# Patient Record
Sex: Female | Born: 1945 | Race: White | Hispanic: No | Marital: Married | Smoking: Never smoker
Health system: Southern US, Community
[De-identification: ages and names within clinical notes are randomized; demographics above are authoritative.]

## PROBLEM LIST (undated history)

## (undated) DIAGNOSIS — K219 Gastro-esophageal reflux disease without esophagitis: Secondary | ICD-10-CM

## (undated) DIAGNOSIS — R51 Headache: Secondary | ICD-10-CM

## (undated) DIAGNOSIS — F419 Anxiety disorder, unspecified: Secondary | ICD-10-CM

## (undated) DIAGNOSIS — M1990 Unspecified osteoarthritis, unspecified site: Secondary | ICD-10-CM

## (undated) DIAGNOSIS — E079 Disorder of thyroid, unspecified: Secondary | ICD-10-CM

## (undated) DIAGNOSIS — H539 Unspecified visual disturbance: Secondary | ICD-10-CM

## (undated) DIAGNOSIS — F909 Attention-deficit hyperactivity disorder, unspecified type: Secondary | ICD-10-CM

## (undated) DIAGNOSIS — R519 Headache, unspecified: Secondary | ICD-10-CM

## (undated) HISTORY — PX: BREAST LUMPECTOMY: SHX2

## (undated) HISTORY — DX: Unspecified visual disturbance: H53.9

## (undated) HISTORY — DX: Headache: R51

## (undated) HISTORY — PX: ABDOMINAL HYSTERECTOMY: SHX81

## (undated) HISTORY — PX: BLADDER SURGERY: SHX569

## (undated) HISTORY — DX: Headache, unspecified: R51.9

---

## 2014-06-12 DIAGNOSIS — M810 Age-related osteoporosis without current pathological fracture: Secondary | ICD-10-CM | POA: Insufficient documentation

## 2014-06-12 DIAGNOSIS — F988 Other specified behavioral and emotional disorders with onset usually occurring in childhood and adolescence: Secondary | ICD-10-CM | POA: Insufficient documentation

## 2014-06-12 DIAGNOSIS — M545 Low back pain, unspecified: Secondary | ICD-10-CM | POA: Insufficient documentation

## 2014-06-12 DIAGNOSIS — M159 Polyosteoarthritis, unspecified: Secondary | ICD-10-CM | POA: Insufficient documentation

## 2014-06-12 DIAGNOSIS — R3911 Hesitancy of micturition: Secondary | ICD-10-CM | POA: Insufficient documentation

## 2014-10-02 ENCOUNTER — Encounter: Payer: Self-pay | Admitting: Neurology

## 2014-10-02 ENCOUNTER — Ambulatory Visit (INDEPENDENT_AMBULATORY_CARE_PROVIDER_SITE_OTHER): Payer: Medicare Other | Admitting: Neurology

## 2014-10-02 VITALS — BP 148/96 | HR 84 | Resp 14 | Ht 64.0 in | Wt 182.8 lb

## 2014-10-02 DIAGNOSIS — R200 Anesthesia of skin: Secondary | ICD-10-CM

## 2014-10-02 DIAGNOSIS — R260 Ataxic gait: Secondary | ICD-10-CM | POA: Insufficient documentation

## 2014-10-02 DIAGNOSIS — F909 Attention-deficit hyperactivity disorder, unspecified type: Secondary | ICD-10-CM

## 2014-10-02 DIAGNOSIS — F988 Other specified behavioral and emotional disorders with onset usually occurring in childhood and adolescence: Secondary | ICD-10-CM

## 2014-10-02 DIAGNOSIS — M542 Cervicalgia: Secondary | ICD-10-CM

## 2014-10-02 DIAGNOSIS — R51 Headache: Principal | ICD-10-CM

## 2014-10-02 DIAGNOSIS — R519 Headache, unspecified: Secondary | ICD-10-CM | POA: Insufficient documentation

## 2014-10-02 DIAGNOSIS — G4489 Other headache syndrome: Secondary | ICD-10-CM

## 2014-10-02 MED ORDER — OXCARBAZEPINE 150 MG PO TABS: ORAL_TABLET | ORAL | Status: DC

## 2014-10-02 NOTE — Progress Notes (Signed)
[facility]  PATIENT: Nicole Duncan DOB: 09-19-1945  REFERRING CLINICIAN: Al Hawks HISTORY FROM: patient REASON FOR VISIT: Headache   HISTORICAL  CHIEF COMPLAINT:  Chief Complaint  Patient presents with  . Headache    C/O short episodes of "electrical shock" sensation right side of head onset 4-6 weeks ago.  Sts. h/a's are worse.  Had been seen by [facility] at [facility] for neck pain, h/a, received tpi's there./fim  . Neck Pain    HISTORY OF PRESENT ILLNESS:  Nicole Duncan is a 69 year old woman who had an electric shock sensatoin on the rigth side of the head that lasted just seconds but was very intense.   Since then, she has had more of these episodes.   They now occur several times a day and she has some pain in between the episodes.    Pain is always on the right.     The intense pains occur randomly in any position.   Episodes started 6 - 7 weeks ago and there was nothing associated with the onset.   She denies vision changes with the headaches.   She had cataract surgery last year bilaterally.    She denies neck, shoulder or jaw pain.   No masticatory claudication.     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      I last saw her in 2012 when she was having neck pain.    A trigger point injection and tramadol helped at that time.    In 2003, she had had memory issues and an MRI showed non-=specific foci scatterred.    A repeat MRI on 2010 did not show much change.    MS is unlikely.     She still notes occasional memory issues and she makes mistakes frequently.     Adderall has helped her cognitive issues  I reviewed about 60 pages of notes from [facility] neurology from 2008 through 2012. An MRI of the cervical spine on 12/18/2009 showed multilevel degenerative changes with small disc protrusions at C3-C4 and C4-C5 and a broad disc protrusion combined with  spondylitic spurring at C5-C6. There did not appear to be nerve root impingement at any of these levels. A nerve conduction/EMG dated 06/02/2009 was normal in the left arm and left leg. A polysomnogram dated 12/19/2008 did not show any significant sleep apnea. However, she did have severe snoring. Sleep efficiency was slightly reduced at 82% due to fragmented sleep.  An MRI of the brain from 2010 showed multiple white matter foci specific pattern.    REVIEW OF SYSTEMS:  Constitutional: No fevers, chills, sweats, or change in appetite Eyes: No visual changes, double vision, eye pain Ear, nose and throat: No hearing loss, ear pain, nasal congestion, sore throat Cardiovascular: No chest pain, palpitations Respiratory:  No shortness of breath at rest or with exertion.   No wheezes GastrointestinaI: No nausea, vomiting, diarrhea, abdominal pain, fecal incontinence Genitourinary:  No dysuria, urinary retention or frequency.  No nocturia. Musculoskeletal:  No neck pain, back pain Integumentary: No rash, pruritus, skin lesions Neurological: as above Psychiatric: No depression at this time.  No anxiety Endocrine: No palpitations, diaphoresis, change in appetite, change in weigh or increased thirst Hematologic/Lymphatic:  No anemia, purpura, petechiae. Allergic/Immunologic: No itchy/runny eyes, nasal congestion, recent allergic reactions, rashes  ALLERGIES: Allergies  Allergen Reactions  .  Prochlorperazine     HOME MEDICATIONS:  Current outpatient prescriptions:  .  [START ON 10/22/2014] amphetamine-dextroamphetamine (ADDERALL) 12.5 MG tablet, Take 12.5 mg by mouth., Disp: , Rfl:  .  bumetanide (BUMEX) 1 MG tablet, , Disp: , Rfl: 5 .  cyanocobalamin (,VITAMIN B-12,) 1000 MCG/ML injection, , Disp: , Rfl: 1 .  liothyronine (CYTOMEL) 25 MCG tablet, Take 25 mcg by mouth daily., Disp: , Rfl: 5 .  OXcarbazepine (TRILEPTAL) 150 MG tablet, 1 pill in am and 2 pills at night, Disp: 90 tablet, Rfl: 11 .   SYNTHROID 100 MCG tablet, Take 100 mcg by mouth daily., Disp: , Rfl: 5   PAST MEDICAL HISTORY: Past Medical History  Diagnosis Date  . Headache   . Vision abnormalities     PAST SURGICAL HISTORY: Past Surgical History  Procedure Laterality Date  . Abdominal hysterectomy    . Bladder surgery    . Breast lumpectomy      FAMILY HISTORY: Family History  Problem Relation Age of Onset  . Dementia Mother   . Lung cancer Father     SOCIAL HISTORY:  History   Social History  . Marital Status: Married    Spouse Name: N/A    Number of Children: N/A  . Years of Education: N/A   Occupational History  . Not on file.   Social History Main Topics  . Smoking status: Never Smoker   . Smokeless tobacco: Not on file  . Alcohol Use: No  . Drug Use: No  . Sexual Activity: Not on file   Other Topics Concern  . Not on file   Social History Narrative  . No narrative on file     PHYSICAL EXAM  Filed Vitals:   10/02/14 1255  BP: 148/96  Pulse: 84  Resp: 14  Height: _0  (1.626 m)  Weight: 182 lb 12.8 oz (82.918 kg)    Body mass index is 31.36 kg/(m^2).   General: The patient is well-developed and well-nourished and in no acute distress  Neck: The neck is supple, no carotid bruits are noted.  The neck is mildly tender and ROM is reducedr.  Respiratory: The respiratory examination is clear.  Cardiovascular: The cardiovascular examination reveals a regular rate and rhythm, no murmurs, gallops or rubs are noted.  Skin: Extremities are without significant edema.  Neurologic Exam  Mental status: The patient is alert and oriented x 3 at the time of the examination. The patient has apparent normal recent and remote memory, with an apparently normal attention span and concentration ability.   Speech is normal.  Cranial nerves: Extraocular movements are full. Pupils are equal, round, and reactive to light and accomodation.  Visual fields are full.  Facial symmetry is  present. There is good facial sensation to soft touch bilaterally.Facial strength is normal.  Trapezius and sternocleidomastoid strength is normal. No dysarthria is noted.  The tongue is midline, and the patient has symmetric elevation of the soft palate. No obvious hearing deficits are noted.  Motor:  Muscle bulk and tone are normal. Strength is  5 / 5 in all 4 extremities.   Sensory: Sensory testing is intact to pinprick, soft touch on all 4 extremities but decreased vibratrion sensation in toes.  Coordination: Cerebellar testing reveals good finger-nose-finger and heel-to-shin bilaterally.  Gait and station: Station and gait are normal. Tandem gait is wide. Romberg is positive.   Reflexes: Deep tendon reflexes are symmetric and normal bilaterally. Plantar responses are normal.    DIAGNOSTIC  DATA (LABS, IMAGING, TESTING) - I reviewed patient records, labs, notes, testing and imaging myself where available.     ASSESSMENT AND PLAN  Other headache syndrome - Plan: MR Brain W Wo Contrast, Sedimentation Rate, Vitamin B12, PE+Interp(Rfx IFE),S, Glom Filt Rate, Estimated, Basic Metabolic Panel, CANCELED: BMP with eGFR, CANCELED: Electrophoresis, Protein and Immunofixation  Neck pain - Plan: MR Cervical Spine Wo Contrast, Sedimentation Rate  Ataxic gait - Plan: MR Brain W Wo Contrast, MR Cervical Spine Wo Contrast, Sedimentation Rate, Vitamin B12, PE+Interp(Rfx IFE),S, Basic Metabolic Panel, CANCELED: BMP with eGFR, CANCELED: Electrophoresis, Protein and Immunofixation  Numbness - Plan: MR Brain W Wo Contrast, MR Cervical Spine Wo Contrast, Sedimentation Rate, Vitamin B12, PE+Interp(Rfx IFE),S, Glom Filt Rate, Estimated, Basic Metabolic Panel, CANCELED: BMP with eGFR, CANCELED: Electrophoresis, Protein and Immunofixation  ADD (attention deficit disorder)   In summary, a 69 year old woman I have seen 4 years ago for neck pain who now has 2 new problems. She is experiencing sharp very  intermittent headaches superimposed on a milder chronic headache. Additionally she has had a progressive gait disturbance with loss of balance over the past few months. Etiology of the headache is not clear and I will check an ESR to rule out the possibility of temporal arteritis. Additionally, we will check an MRI of the head to rule out a structural etiology this is a recently acquired leg syndrome. I will place her on 150 mg by mouth 3 times a day oxcarbazepine. For her worsening gait issues, I will check an MRI of the cervical spine to make sure there is not a cervical myelopathy and also check vitamin B12 and SPEP as she has numbness in her feet. The possible neuropathy is does not seem bad enough to explain poor gait, however.  She will return to see me in about 2 months or sooner if she has new or worsening symptoms based on results. If her gait continues to worsen, I will check a nerve conduction study and EMG    Antonio Creswell A. Felecia Shelling, MD, PhD 09/04/1094, 0:45 PM Certified in Neurology, [place], Sleep Medicine, Pain Medicine and Neuroimaging  [facility] [address] [place], [place] [address] [phone]

## 2014-10-03 LAB — SEDIMENTATION RATE: SED RATE: 5 mm/h (ref 0–40)

## 2014-10-03 LAB — VITAMIN B12: VITAMIN B 12: 1177 pg/mL — AB (ref 211–946)

## 2014-10-04 LAB — PE+INTERP(RFX IFE),S
A/G RATIO SPE: 1.5 (ref 0.7–2.0)
ALBUMIN ELP: 4.4 g/dL (ref 3.2–5.6)
Alpha 1: 0.2 g/dL (ref 0.1–0.4)
Alpha 2: 0.9 g/dL (ref 0.4–1.2)
Beta: 1 g/dL (ref 0.6–1.3)
Gamma Globulin: 0.9 g/dL (ref 0.5–1.6)
Globulin, Total: 3 g/dL (ref 2.0–4.5)
Total Protein: 7.4 g/dL (ref 6.0–8.5)

## 2014-10-04 LAB — BASIC METABOLIC PANEL
BUN/Creatinine Ratio: 15 (ref 11–26)
BUN: 10 mg/dL (ref 8–27)
CALCIUM: 10.4 mg/dL — AB (ref 8.7–10.3)
CHLORIDE: 101 mmol/L (ref 97–108)
CO2: 25 mmol/L (ref 18–29)
Creatinine, Ser: 0.68 mg/dL (ref 0.57–1.00)
GFR calc Af Amer: 105 mL/min/{1.73_m2} (ref >59)
GFR, EST NON AFRICAN AMERICAN: 91 mL/min/{1.73_m2} (ref >59)
GLUCOSE: 96 mg/dL (ref 65–99)
Potassium: 4.6 mmol/L (ref 3.5–5.2)
Sodium: 143 mmol/L (ref 134–144)

## 2014-10-10 ENCOUNTER — Ambulatory Visit
Admission: RE | Admit: 2014-10-10 | Discharge: 2014-10-10 | Disposition: A | Discharge status: Home / Self Care | Payer: Medicare Other | Source: Ambulatory Visit | Attending: Neurology | Admitting: Neurology

## 2014-10-10 DIAGNOSIS — R260 Ataxic gait: Secondary | ICD-10-CM

## 2014-10-10 DIAGNOSIS — G4489 Other headache syndrome: Secondary | ICD-10-CM

## 2014-10-10 DIAGNOSIS — R200 Anesthesia of skin: Secondary | ICD-10-CM

## 2014-10-10 DIAGNOSIS — M542 Cervicalgia: Secondary | ICD-10-CM

## 2014-10-10 MED ORDER — DIPHENHYDRAMINE HCL 50 MG PO CAPS
50.0000 mg | ORAL_CAPSULE | Freq: Once | ORAL | Status: AC
  Administered 2014-10-10: 50 mg via ORAL

## 2014-10-10 MED ORDER — GADOBENATE DIMEGLUMINE 529 MG/ML IV SOLN
15.0000 mL | Freq: Once | INTRAVENOUS | Status: AC | PRN
  Administered 2014-10-10: 15 mL via INTRAVENOUS

## 2014-10-10 NOTE — Progress Notes (Signed)
Pt developed hives and itching during MRI. Benadyl given and Dr. Jola Baptist in to speak to pt and her husband.

## 2014-10-15 ENCOUNTER — Telehealth: Payer: Self-pay | Admitting: *Deleted

## 2014-10-15 NOTE — Telephone Encounter (Signed)
Patient calling wanting MRI results. Patient missed call

## 2014-10-15 NOTE — Telephone Encounter (Signed)
Spoke with Nicole Duncan and per RAS, advised that mri brain is very similar to mri brain done in 2010, that there are a couple of small spots that should not produce sx.  Advised that mri c-spine basically unchanged from 2011.  She verbalized understanding of same/fim

## 2014-10-20 ENCOUNTER — Other Ambulatory Visit: Payer: Self-pay

## 2014-12-12 DIAGNOSIS — I10 Essential (primary) hypertension: Secondary | ICD-10-CM | POA: Insufficient documentation

## 2015-04-09 DIAGNOSIS — K219 Gastro-esophageal reflux disease without esophagitis: Secondary | ICD-10-CM | POA: Insufficient documentation

## 2015-04-09 DIAGNOSIS — K659 Peritonitis, unspecified: Secondary | ICD-10-CM | POA: Insufficient documentation

## 2015-05-15 DIAGNOSIS — R002 Palpitations: Secondary | ICD-10-CM | POA: Insufficient documentation

## 2015-09-15 DIAGNOSIS — Z139 Encounter for screening, unspecified: Secondary | ICD-10-CM | POA: Diagnosis not present

## 2015-09-15 DIAGNOSIS — Z1231 Encounter for screening mammogram for malignant neoplasm of breast: Secondary | ICD-10-CM | POA: Diagnosis not present

## 2015-10-08 DIAGNOSIS — M150 Primary generalized (osteo)arthritis: Secondary | ICD-10-CM | POA: Diagnosis not present

## 2015-10-08 DIAGNOSIS — M79672 Pain in left foot: Secondary | ICD-10-CM | POA: Diagnosis not present

## 2015-10-08 DIAGNOSIS — R51 Headache: Secondary | ICD-10-CM | POA: Diagnosis not present

## 2015-10-08 DIAGNOSIS — E039 Hypothyroidism, unspecified: Secondary | ICD-10-CM | POA: Diagnosis not present

## 2015-10-08 DIAGNOSIS — F988 Other specified behavioral and emotional disorders with onset usually occurring in childhood and adolescence: Secondary | ICD-10-CM | POA: Diagnosis not present

## 2015-10-08 DIAGNOSIS — I10 Essential (primary) hypertension: Secondary | ICD-10-CM | POA: Diagnosis not present

## 2015-10-08 DIAGNOSIS — M79673 Pain in unspecified foot: Secondary | ICD-10-CM | POA: Insufficient documentation

## 2015-12-27 DIAGNOSIS — I10 Essential (primary) hypertension: Secondary | ICD-10-CM | POA: Diagnosis not present

## 2015-12-27 DIAGNOSIS — R112 Nausea with vomiting, unspecified: Secondary | ICD-10-CM | POA: Diagnosis not present

## 2015-12-27 DIAGNOSIS — N390 Urinary tract infection, site not specified: Secondary | ICD-10-CM | POA: Diagnosis not present

## 2015-12-27 DIAGNOSIS — K529 Noninfective gastroenteritis and colitis, unspecified: Secondary | ICD-10-CM | POA: Diagnosis not present

## 2015-12-27 DIAGNOSIS — R110 Nausea: Secondary | ICD-10-CM | POA: Diagnosis not present

## 2015-12-27 DIAGNOSIS — K2970 Gastritis, unspecified, without bleeding: Secondary | ICD-10-CM | POA: Diagnosis not present

## 2015-12-27 DIAGNOSIS — R1111 Vomiting without nausea: Secondary | ICD-10-CM | POA: Diagnosis not present

## 2015-12-27 DIAGNOSIS — R109 Unspecified abdominal pain: Secondary | ICD-10-CM | POA: Diagnosis not present

## 2015-12-30 DIAGNOSIS — R1314 Dysphagia, pharyngoesophageal phase: Secondary | ICD-10-CM | POA: Diagnosis not present

## 2015-12-30 DIAGNOSIS — K219 Gastro-esophageal reflux disease without esophagitis: Secondary | ICD-10-CM | POA: Diagnosis not present

## 2015-12-30 DIAGNOSIS — R1030 Lower abdominal pain, unspecified: Secondary | ICD-10-CM | POA: Diagnosis not present

## 2015-12-30 DIAGNOSIS — R1013 Epigastric pain: Secondary | ICD-10-CM | POA: Diagnosis not present

## 2016-01-20 DIAGNOSIS — R1030 Lower abdominal pain, unspecified: Secondary | ICD-10-CM | POA: Diagnosis not present

## 2016-01-20 DIAGNOSIS — K229 Disease of esophagus, unspecified: Secondary | ICD-10-CM | POA: Diagnosis not present

## 2016-01-20 DIAGNOSIS — R1310 Dysphagia, unspecified: Secondary | ICD-10-CM | POA: Diagnosis not present

## 2016-01-20 DIAGNOSIS — K649 Unspecified hemorrhoids: Secondary | ICD-10-CM | POA: Diagnosis not present

## 2016-01-20 DIAGNOSIS — Z1211 Encounter for screening for malignant neoplasm of colon: Secondary | ICD-10-CM | POA: Diagnosis not present

## 2016-02-18 ENCOUNTER — Other Ambulatory Visit: Payer: Self-pay | Admitting: Neurology

## 2016-02-26 ENCOUNTER — Ambulatory Visit (INDEPENDENT_AMBULATORY_CARE_PROVIDER_SITE_OTHER): Payer: PPO | Admitting: Neurology

## 2016-02-26 ENCOUNTER — Encounter: Payer: Self-pay | Admitting: Neurology

## 2016-02-26 VITALS — BP 148/88 | HR 76 | Resp 18 | Ht 64.0 in | Wt 185.0 lb

## 2016-02-26 DIAGNOSIS — M542 Cervicalgia: Secondary | ICD-10-CM | POA: Diagnosis not present

## 2016-02-26 DIAGNOSIS — R51 Headache: Secondary | ICD-10-CM

## 2016-02-26 DIAGNOSIS — M545 Low back pain, unspecified: Secondary | ICD-10-CM

## 2016-02-26 DIAGNOSIS — R930 Abnormal findings on diagnostic imaging of skull and head, not elsewhere classified: Secondary | ICD-10-CM | POA: Diagnosis not present

## 2016-02-26 DIAGNOSIS — R519 Headache, unspecified: Secondary | ICD-10-CM

## 2016-02-26 DIAGNOSIS — R9082 White matter disease, unspecified: Secondary | ICD-10-CM | POA: Insufficient documentation

## 2016-02-26 MED ORDER — OXCARBAZEPINE 150 MG PO TABS: ORAL_TABLET | ORAL | Status: DC

## 2016-02-26 NOTE — Progress Notes (Signed)
[facility]  PATIENT: Nicole Duncan DOB: 1946-01-14  REFERRING CLINICIAN: Al Hawks HISTORY FROM: patient REASON FOR VISIT: Headache   HISTORICAL  CHIEF COMPLAINT:  Chief Complaint  Patient presents with  . Neck Pain    Sts. neck pain is improved. Sts. electrical shock sensation h/a's are generally improved with Oxcarbazepine, but have been some worse lately with extra stress associated with her husband's poor health./fim  . Headache  . Gait Disturbance    HISTORY OF PRESENT ILLNESS:  Nicole Duncan is a 70 year old woman who was having frequent electric shock sensations on the rigth side of the head lasting at a time.   Since starting oxcarbazepine (150 am -300 mg pm) she is much better.   They now occur 0-2  times a day   The intense pains occur randomly in any position.    She has other types of headaches that occur with less intensity but last for hours and seem to start in his neck.    She denies vision changes with the headaches.    She denies neck, shoulder or jaw pain.   No masticatory claudication.     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 Trigger point injections have helped a lot.    An MRI of the cervical spine on 12/18/2009 showed multilevel degenerative changes with small disc protrusions at C3-C4 and C4-C5 and a broad disc protrusion combined with spondylitic spurring at C5-C6. There did not appear to be nerve root impingement at any of these levels. A nerve conduction/EMG dated 06/02/2009 was normal in the left arm and left leg.    Other:  In 2003, she had had memory issues and an MRI showed non-=specific foci scatterred.    A repeat MRI on 2010 did not show much change.    MS is unlikely.     She still notes occasional memory issues and she makes mistakes frequently.     Adderall has helped her cognitive issues    A polysomnogram dated 12/19/2008  did not show any significant sleep apnea. However, she did have severe snoring. Sleep efficiency was slightly reduced at 82% due to fragmented sleep.     REVIEW OF SYSTEMS:  Constitutional: No fevers, chills, sweats, or change in appetite.   Some insomnia Eyes: No visual changes, double vision, eye pain Ear, nose and throat: No hearing loss, ear pain, nasal congestion, sore throat Cardiovascular: No chest pain, palpitations Respiratory:  No shortness of breath at rest or with exertion.   No wheezes.   Snores at night GastrointestinaI: No nausea, vomiting, diarrhea, abdominal pain, fecal incontinence Genitourinary:  No dysuria, urinary retention or frequency.  No nocturia. Musculoskeletal:  as above Integumentary: No rash, pruritus, skin lesions Neurological: as above Psychiatric: No depression at this time.  No anxiety Endocrine: No palpitations, diaphoresis, change in appetite, change in weigh or increased thirst Hematologic/Lymphatic:  No anemia, purpura, petechiae. Allergic/Immunologic: No itchy/runny eyes, nasal congestion, recent allergic reactions, rashes  ALLERGIES: Allergies  Allergen Reactions  . Gadolinium Derivatives Hives and Itching  . Compazine [Prochlorperazine Edisylate] Other (See Comments)    "Draws" eyes and mouth  . Prochlorperazine Other (See Comments)    "Draws" eyes and mouth  . Iodinated Diagnostic Agents Rash    HOME MEDICATIONS:  Current outpatient prescriptions:  .  amphetamine-dextroamphetamine (ADDERALL) 12.5 MG tablet, Take 12.5  mg by mouth., Disp: , Rfl:  .  bumetanide (BUMEX) 1 MG tablet, , Disp: , Rfl: 5 .  cyanocobalamin (,VITAMIN B-12,) 1000 MCG/ML injection, , Disp: , Rfl: 1 .  liothyronine (CYTOMEL) 25 MCG tablet, Take 25 mcg by mouth daily., Disp: , Rfl: 5 .  omeprazole (PRILOSEC) 40 MG capsule, Take 40 mg by mouth., Disp: , Rfl:  .  OXcarbazepine (TRILEPTAL) 150 MG tablet, 1 pill in am and 2 pills at night, Disp: 90 tablet, Rfl: 11 .   SYNTHROID 100 MCG tablet, Take 100 mcg by mouth daily., Disp: , Rfl: 5   PAST MEDICAL HISTORY: Past Medical History  Diagnosis Date  . Headache   . Vision abnormalities     PAST SURGICAL HISTORY: Past Surgical History  Procedure Laterality Date  . Abdominal hysterectomy    . Bladder surgery    . Breast lumpectomy      FAMILY HISTORY: Family History  Problem Relation Age of Onset  . Dementia Mother   . Lung cancer Father     SOCIAL HISTORY:  Social History   Social History  . Marital Status: Married    Spouse Name: N/A  . Number of Children: N/A  . Years of Education: N/A   Occupational History  . Not on file.   Social History Main Topics  . Smoking status: Never Smoker   . Smokeless tobacco: Not on file  . Alcohol Use: No  . Drug Use: No  . Sexual Activity: Not on file   Other Topics Concern  . Not on file   Social History Narrative     PHYSICAL EXAM  Filed Vitals:   02/26/16 0911  BP: 148/88  Pulse: 76  Resp: 18  Height: 5\' 4"  (1.626 m)  Weight: 185 lb (83.915 kg)    Body mass index is 31.74 kg/(m^2).   General: The patient is well-developed and well-nourished and in no acute distress  Neck: The neck is supple.  The neck is non - tender and ROM is mildly reduced.  Respiratory: The respiratory examination is clear.  Cardiovascular: The cardiovascular examination reveals a regular rate and rhythm, no murmurs, gallops or rubs are noted.  Skin: Extremities are without rash or edema.  Musculoskeletal:   Has some arthritic deformities in hands.  Neurologic Exam  Mental status: The patient is alert and oriented x 3 at the time of the examination. The patient has apparent normal recent and remote memory, with an apparently normal attention span and concentration ability.   Speech is normal.  Cranial nerves: Extraocular movements are full.  . There is good facial sensation to soft touch bilaterally.Facial strength is normal.  Trapezius and  sternocleidomastoid strength is normal. No dysarthria is noted.  The tongue is midline, and the patient has symmetric elevation of the soft palate. No obvious hearing deficits are noted.  Motor:  Muscle bulk and tone are normal. Strength is  5 / 5 in all 4 extremities.   Sensory: Sensory testing is intact to soft touch on all 4 extremities but decreased vibratrion sensation in toes.  Coordination: Cerebellar testing reveals good finger-nose-fingerbilaterally.  Gait and station: Station and gait are normal. Tandem gait is wide.   Reflexes: Deep tendon reflexes are symmetric and normal bilaterally. Plantar responses are normal.    DIAGNOSTIC DATA (LABS, IMAGING, TESTING) - I reviewed patient records, labs, notes, testing and imaging myself where available.     ASSESSMENT AND PLAN  Headache disorder  Neck pain  Midline low  back pain without sciatica  White matter abnormality on MRI of brain    1.  Refill oxcarbazepine 2.  Stay active, exercise as tolerated  She will return to see me in about one year or sooner if she has new or worsening symptoms based on results.     Alayah Knouff A. Felecia Shelling, MD, PhD [id number], [id number] AM Certified in Neurology, Clinical Neurophysiology, Sleep Medicine, Pain Medicine and Neuroimaging  [facility] [address], [place], [place] [address] [phone]

## 2016-04-01 DIAGNOSIS — F419 Anxiety disorder, unspecified: Secondary | ICD-10-CM | POA: Diagnosis not present

## 2016-04-01 DIAGNOSIS — M170 Bilateral primary osteoarthritis of knee: Secondary | ICD-10-CM | POA: Diagnosis not present

## 2016-04-01 DIAGNOSIS — F988 Other specified behavioral and emotional disorders with onset usually occurring in childhood and adolescence: Secondary | ICD-10-CM | POA: Diagnosis not present

## 2016-04-01 DIAGNOSIS — Z23 Encounter for immunization: Secondary | ICD-10-CM | POA: Diagnosis not present

## 2016-04-01 DIAGNOSIS — M65342 Trigger finger, left ring finger: Secondary | ICD-10-CM | POA: Diagnosis not present

## 2016-04-01 DIAGNOSIS — R300 Dysuria: Secondary | ICD-10-CM | POA: Diagnosis not present

## 2016-04-05 DIAGNOSIS — Z23 Encounter for immunization: Secondary | ICD-10-CM | POA: Diagnosis not present

## 2016-05-18 DIAGNOSIS — I10 Essential (primary) hypertension: Secondary | ICD-10-CM | POA: Diagnosis not present

## 2016-05-18 DIAGNOSIS — F4322 Adjustment disorder with anxiety: Secondary | ICD-10-CM | POA: Diagnosis not present

## 2016-05-18 DIAGNOSIS — M150 Primary generalized (osteo)arthritis: Secondary | ICD-10-CM | POA: Diagnosis not present

## 2016-05-18 DIAGNOSIS — Z23 Encounter for immunization: Secondary | ICD-10-CM | POA: Diagnosis not present

## 2016-05-18 DIAGNOSIS — F988 Other specified behavioral and emotional disorders with onset usually occurring in childhood and adolescence: Secondary | ICD-10-CM | POA: Diagnosis not present

## 2016-05-19 ENCOUNTER — Telehealth: Payer: Self-pay | Admitting: *Deleted

## 2016-05-19 MED ORDER — OXCARBAZEPINE 150 MG PO TABS: ORAL_TABLET | ORAL | 3 refills | Status: AC

## 2016-05-19 NOTE — Telephone Encounter (Signed)
Received fax request for 90 day supply of Oxcarbazepine.  Rx. escribed to [facility] as requested/fim

## 2016-06-07 DIAGNOSIS — M25561 Pain in right knee: Secondary | ICD-10-CM | POA: Diagnosis not present

## 2016-06-07 DIAGNOSIS — M25562 Pain in left knee: Secondary | ICD-10-CM | POA: Diagnosis not present

## 2016-06-07 DIAGNOSIS — M7051 Other bursitis of knee, right knee: Secondary | ICD-10-CM | POA: Diagnosis not present

## 2016-06-07 DIAGNOSIS — M150 Primary generalized (osteo)arthritis: Secondary | ICD-10-CM | POA: Diagnosis not present

## 2016-06-07 DIAGNOSIS — M7052 Other bursitis of knee, left knee: Secondary | ICD-10-CM | POA: Diagnosis not present

## 2016-06-29 DIAGNOSIS — H43393 Other vitreous opacities, bilateral: Secondary | ICD-10-CM | POA: Diagnosis not present

## 2016-06-29 DIAGNOSIS — Z961 Presence of intraocular lens: Secondary | ICD-10-CM | POA: Diagnosis not present

## 2016-06-29 DIAGNOSIS — H5203 Hypermetropia, bilateral: Secondary | ICD-10-CM | POA: Diagnosis not present

## 2016-06-29 DIAGNOSIS — H52203 Unspecified astigmatism, bilateral: Secondary | ICD-10-CM | POA: Diagnosis not present

## 2016-06-29 DIAGNOSIS — H524 Presbyopia: Secondary | ICD-10-CM | POA: Diagnosis not present

## 2016-06-29 DIAGNOSIS — H16223 Keratoconjunctivitis sicca, not specified as Sjogren's, bilateral: Secondary | ICD-10-CM | POA: Diagnosis not present

## 2016-09-22 DIAGNOSIS — Z1231 Encounter for screening mammogram for malignant neoplasm of breast: Secondary | ICD-10-CM | POA: Diagnosis not present

## 2016-09-28 DIAGNOSIS — M65342 Trigger finger, left ring finger: Secondary | ICD-10-CM | POA: Diagnosis not present

## 2016-09-28 DIAGNOSIS — M1711 Unilateral primary osteoarthritis, right knee: Secondary | ICD-10-CM | POA: Diagnosis not present

## 2016-09-28 DIAGNOSIS — E539 Vitamin B deficiency, unspecified: Secondary | ICD-10-CM | POA: Diagnosis not present

## 2016-11-16 DIAGNOSIS — I10 Essential (primary) hypertension: Secondary | ICD-10-CM | POA: Diagnosis not present

## 2016-11-16 DIAGNOSIS — E538 Deficiency of other specified B group vitamins: Secondary | ICD-10-CM | POA: Diagnosis not present

## 2017-01-05 DIAGNOSIS — F988 Other specified behavioral and emotional disorders with onset usually occurring in childhood and adolescence: Secondary | ICD-10-CM | POA: Diagnosis not present

## 2017-01-05 DIAGNOSIS — I10 Essential (primary) hypertension: Secondary | ICD-10-CM | POA: Diagnosis not present

## 2017-01-05 DIAGNOSIS — R5383 Other fatigue: Secondary | ICD-10-CM | POA: Diagnosis not present

## 2017-02-08 DIAGNOSIS — R3911 Hesitancy of micturition: Secondary | ICD-10-CM | POA: Diagnosis not present

## 2017-02-08 DIAGNOSIS — R339 Retention of urine, unspecified: Secondary | ICD-10-CM | POA: Diagnosis not present

## 2017-03-08 ENCOUNTER — Encounter: Payer: Self-pay | Admitting: Neurology

## 2017-03-08 ENCOUNTER — Ambulatory Visit (INDEPENDENT_AMBULATORY_CARE_PROVIDER_SITE_OTHER): Payer: PPO | Admitting: Neurology

## 2017-03-08 VITALS — BP 142/76 | HR 71 | Resp 20 | Ht 64.0 in | Wt 196.5 lb

## 2017-03-08 DIAGNOSIS — R200 Anesthesia of skin: Secondary | ICD-10-CM | POA: Diagnosis not present

## 2017-03-08 DIAGNOSIS — R51 Headache: Secondary | ICD-10-CM | POA: Diagnosis not present

## 2017-03-08 DIAGNOSIS — R3911 Hesitancy of micturition: Secondary | ICD-10-CM | POA: Diagnosis not present

## 2017-03-08 DIAGNOSIS — M542 Cervicalgia: Secondary | ICD-10-CM | POA: Diagnosis not present

## 2017-03-08 DIAGNOSIS — R519 Headache, unspecified: Secondary | ICD-10-CM

## 2017-03-08 NOTE — Progress Notes (Signed)
[facility]  PATIENT: Nicole Duncan DOB: 1946-01-28  REFERRING CLINICIAN: Al Hawks HISTORY FROM: patient REASON FOR VISIT: Headache   HISTORICAL  CHIEF COMPLAINT:  Chief Complaint  Patient presents with  . Headache    Sts. h/a's  have been ok.  No more "zings in my head" since taking Oxcarbazepine.  Is having difficulty with urine stream and sts. urologist says Oxcarbazepine may be causing this/fim  . Neck Pain    HISTORY OF PRESENT ILLNESS:  Nicole Duncan is a 71 year old woman who was having frequent electric shock sensations on the rigth side of the head  She started oxcarbazepine (150 am -300 mg pm) and the dysesthetic sensations got much better.    She is now taking 150 mg bid.    Since last year she has also had urinary retention and her urologist told her that trileptal may be playing a role.   She has only had a few 'zings' the past 6 months.    The intense pains occur randomly in any position.    She has other types of headaches that occur with less intensity but last for hours and seem to start in his neck.    She denies vision changes with the headaches.    She denies neck, shoulder or jaw pain.   No masticatory claudication.     She reports some neck pain but it was worse in the past.    The trigger point injwections helped her pain a lot and severe pain never returned.     An MRI of the cervical spine on 12/18/2009 showed multilevel degenerative changes with small disc protrusions at C3-C4 and C4-C5 and a broad disc protrusion combined with spondylitic spurring at C5-C6. There did not appear to be nerve root impingement at any of these levels. A nerve conduction/EMG dated 06/02/2009 was normal in the left arm and left leg.    Other:  In 2003, she had had memory issues and an MRI showed non-=specific foci scatterred.    A repeat MRI on 2010 did not show much change.    MS is unlikely.     She still notes occasional memory issues and she makes  mistakes frequently.     Adderall has helped her cognitive issues  Snoring/sleep:   She has some sleep onset insomnia and also some sleep maintenance insomnia due to her husbands illness (he hasas severe poyneuropathy)  A polysomnogram dated 12/19/2008 did not show any significant sleep apnea. However, she did have severe snoring. Sleep efficiency was slightly reduced at 82% due to fragmented sleep.     REVIEW OF SYSTEMS:  Constitutional: No fevers, chills, sweats, or change in appetite.   Some insomnia Eyes: No visual changes, double vision, eye pain Ear, nose and throat: No hearing loss, ear pain, nasal congestion, sore throat Cardiovascular: No chest pain, palpitations Respiratory:  No shortness of breath at rest or with exertion.   No wheezes.   Snores at night GastrointestinaI: No nausea, vomiting, diarrhea, abdominal pain, fecal incontinence Genitourinary:  No dysuria, urinary retention or frequency.  No nocturia. Musculoskeletal:  as above Integumentary: No rash, pruritus, skin lesions Neurological: as above Psychiatric: No depression at this time.  No anxiety Endocrine: No palpitations, diaphoresis, change in appetite, change in weigh or increased thirst Hematologic/Lymphatic:  No anemia, purpura, petechiae. Allergic/Immunologic: No itchy/runny eyes, nasal congestion, recent allergic reactions, rashes  ALLERGIES: Allergies  Allergen Reactions  . Gadolinium Derivatives Hives and Itching  . Compazine [Prochlorperazine  Edisylate] Other (See Comments)    "Draws" eyes and mouth  . Prochlorperazine Other (See Comments)    "Draws" eyes and mouth  . Iodinated Diagnostic Agents Rash    HOME MEDICATIONS:  Current Outpatient Prescriptions:  .  amphetamine-dextroamphetamine (ADDERALL) 12.5 MG tablet, Take 12.5 mg by mouth., Disp: , Rfl:  .  bumetanide (BUMEX) 1 MG tablet, , Disp: , Rfl: 5 .  cyanocobalamin (,VITAMIN B-12,) 1000 MCG/ML injection, , Disp: , Rfl: 1 .  liothyronine  (CYTOMEL) 25 MCG tablet, Take 25 mcg by mouth daily., Disp: , Rfl: 5 .  omeprazole (PRILOSEC) 40 MG capsule, Take 40 mg by mouth., Disp: , Rfl:  .  OXcarbazepine (TRILEPTAL) 150 MG tablet, 1 pill in am and 2 pills at night, Disp: 270 tablet, Rfl: 3 .  SYNTHROID 100 MCG tablet, Take 100 mcg by mouth daily., Disp: , Rfl: 5   PAST MEDICAL HISTORY: Past Medical History:  Diagnosis Date  . Headache   . Vision abnormalities     PAST SURGICAL HISTORY: Past Surgical History:  Procedure Laterality Date  . ABDOMINAL HYSTERECTOMY    . BLADDER SURGERY    . BREAST LUMPECTOMY      FAMILY HISTORY: Family History  Problem Relation Age of Onset  . Dementia Mother   . Lung cancer Father     SOCIAL HISTORY:  Social History   Social History  . Marital status: Married    Spouse name: N/A  . Number of children: N/A  . Years of education: N/A   Occupational History  . Not on file.   Social History Main Topics  . Smoking status: Never Smoker  . Smokeless tobacco: Never Used  . Alcohol use No  . Drug use: No  . Sexual activity: Not on file   Other Topics Concern  . Not on file   Social History Narrative  . No narrative on file     PHYSICAL EXAM  Vitals:   03/08/17 1303  BP: (!) 142/76  Pulse: 71  Resp: 20  Weight: 196 lb 8 oz (89.1 kg)  Height: 5\' 4"  (1.626 m)    Body mass index is 33.73 kg/m.   General: The patient is well-developed and well-nourished and in no acute distress  Neck: The neck is nontendereck is non - tender and ROM is mildly reduced.   Neurologic Exam  Mental status: The patient is alert and oriented x 3 at the time of the examination. The patient has apparent normal recent and remote memory, with an apparently normal attention span and concentration ability.   Speech is normal.  Cranial nerves: Extraocular movements are full.  Facial strength and sensation is normal.  The tongue is midline, and the patient has symmetric elevation of the soft  palate. No obvious hearing deficits are noted.  Motor:  Muscle bulk and tone are normal. Strength is  5 / 5 in all 4 extremities.   Sensory: Sensory testing is intact to soft touch on all 4 extremities but decreased vibratrion sensation in toes.  Coordination: Cerebellar testing reveals good finger-nose-fingerbilaterally.  Gait and station: Station and gait are normal. Tandem gait is wide.   Reflexes: Deep tendon reflexes are symmetric and normal bilaterally.      DIAGNOSTIC DATA (LABS, IMAGING, TESTING) - I reviewed patient records, labs, notes, testing and imaging myself where available.     ASSESSMENT AND PLAN  Neck pain  Numbness  Headache disorder  Delayed onset of urination   1.  Try to  taper off the trileptal 2.   Melatonin 3 to 5 mg for insomnia 3.   Try to Stay active, exercise as tolerated 4.    She will return to see me in about one year or sooner if she has new or worsening symptoms based on results.     Robyne Matar A. Felecia Shelling, MD, PhD 04/23/1897, 4:21 PM Certified in Neurology, Clinical Neurophysiology, Sleep Medicine, Pain Medicine and Neuroimaging  [facility] [address], [place], [place] [address] [phone]

## 2017-03-08 NOTE — Patient Instructions (Signed)
Take melatonin 3-5 mg about 60-90 minutes before bedtime. You can get this at any drug store.

## 2017-03-21 DIAGNOSIS — K219 Gastro-esophageal reflux disease without esophagitis: Secondary | ICD-10-CM | POA: Diagnosis not present

## 2017-04-19 DIAGNOSIS — D225 Melanocytic nevi of trunk: Secondary | ICD-10-CM | POA: Diagnosis not present

## 2017-04-19 DIAGNOSIS — Z809 Family history of malignant neoplasm, unspecified: Secondary | ICD-10-CM | POA: Diagnosis not present

## 2017-04-19 DIAGNOSIS — D485 Neoplasm of uncertain behavior of skin: Secondary | ICD-10-CM | POA: Diagnosis not present

## 2017-04-19 DIAGNOSIS — D692 Other nonthrombocytopenic purpura: Secondary | ICD-10-CM | POA: Diagnosis not present

## 2017-04-19 DIAGNOSIS — D2239 Melanocytic nevi of other parts of face: Secondary | ICD-10-CM | POA: Diagnosis not present

## 2017-04-19 DIAGNOSIS — Z1283 Encounter for screening for malignant neoplasm of skin: Secondary | ICD-10-CM | POA: Diagnosis not present

## 2017-05-10 DIAGNOSIS — R197 Diarrhea, unspecified: Secondary | ICD-10-CM | POA: Diagnosis not present

## 2017-05-23 DIAGNOSIS — M150 Primary generalized (osteo)arthritis: Secondary | ICD-10-CM | POA: Diagnosis not present

## 2017-05-23 DIAGNOSIS — R072 Precordial pain: Secondary | ICD-10-CM | POA: Diagnosis not present

## 2017-05-23 DIAGNOSIS — R197 Diarrhea, unspecified: Secondary | ICD-10-CM | POA: Diagnosis not present

## 2017-05-23 DIAGNOSIS — I10 Essential (primary) hypertension: Secondary | ICD-10-CM | POA: Diagnosis not present

## 2017-05-23 DIAGNOSIS — E039 Hypothyroidism, unspecified: Secondary | ICD-10-CM | POA: Diagnosis not present

## 2017-05-23 DIAGNOSIS — Z23 Encounter for immunization: Secondary | ICD-10-CM | POA: Diagnosis not present

## 2017-06-26 ENCOUNTER — Encounter (HOSPITAL_BASED_OUTPATIENT_CLINIC_OR_DEPARTMENT_OTHER): Payer: Self-pay | Admitting: Emergency Medicine

## 2017-06-26 ENCOUNTER — Emergency Department (HOSPITAL_BASED_OUTPATIENT_CLINIC_OR_DEPARTMENT_OTHER): Payer: PPO

## 2017-06-26 ENCOUNTER — Emergency Department (HOSPITAL_BASED_OUTPATIENT_CLINIC_OR_DEPARTMENT_OTHER)
Admission: EM | Admit: 2017-06-26 | Discharge: 2017-06-26 | Disposition: A | Discharge status: Home / Self Care | Payer: PPO | Attending: Emergency Medicine | Admitting: Emergency Medicine

## 2017-06-26 ENCOUNTER — Ambulatory Visit (HOSPITAL_BASED_OUTPATIENT_CLINIC_OR_DEPARTMENT_OTHER): Admission: RE | Admit: 2017-06-26 | Payer: PPO | Source: Ambulatory Visit

## 2017-06-26 DIAGNOSIS — Z79899 Other long term (current) drug therapy: Secondary | ICD-10-CM | POA: Insufficient documentation

## 2017-06-26 DIAGNOSIS — Y929 Unspecified place or not applicable: Secondary | ICD-10-CM | POA: Insufficient documentation

## 2017-06-26 DIAGNOSIS — W1839XA Other fall on same level, initial encounter: Secondary | ICD-10-CM | POA: Diagnosis not present

## 2017-06-26 DIAGNOSIS — S63502A Unspecified sprain of left wrist, initial encounter: Secondary | ICD-10-CM | POA: Insufficient documentation

## 2017-06-26 DIAGNOSIS — Y999 Unspecified external cause status: Secondary | ICD-10-CM | POA: Diagnosis not present

## 2017-06-26 DIAGNOSIS — Y939 Activity, unspecified: Secondary | ICD-10-CM | POA: Insufficient documentation

## 2017-06-26 DIAGNOSIS — S6982XA Other specified injuries of left wrist, hand and finger(s), initial encounter: Secondary | ICD-10-CM | POA: Diagnosis present

## 2017-06-26 DIAGNOSIS — Z7982 Long term (current) use of aspirin: Secondary | ICD-10-CM | POA: Diagnosis not present

## 2017-06-26 DIAGNOSIS — S8391XA Sprain of unspecified site of right knee, initial encounter: Secondary | ICD-10-CM | POA: Diagnosis not present

## 2017-06-26 DIAGNOSIS — S6992XA Unspecified injury of left wrist, hand and finger(s), initial encounter: Secondary | ICD-10-CM | POA: Diagnosis not present

## 2017-06-26 DIAGNOSIS — M79642 Pain in left hand: Secondary | ICD-10-CM | POA: Diagnosis not present

## 2017-06-26 HISTORY — DX: Unspecified osteoarthritis, unspecified site: M19.90

## 2017-06-26 HISTORY — DX: Gastro-esophageal reflux disease without esophagitis: K21.9

## 2017-06-26 HISTORY — DX: Attention-deficit hyperactivity disorder, unspecified type: F90.9

## 2017-06-26 HISTORY — DX: Anxiety disorder, unspecified: F41.9

## 2017-06-26 HISTORY — DX: Disorder of thyroid, unspecified: E07.9

## 2017-06-26 NOTE — Discharge Instructions (Signed)
1.You may take extra strength tylenol for pain. You may add your prescribed Tramadol 1-2 tablets if needed for additional pain control.

## 2017-06-26 NOTE — ED Provider Notes (Signed)
[facility] Provider Note   CSN: [id number] Arrival date & time: 06/26/17  1053     History   Chief Complaint Chief Complaint  Patient presents with  . Fall    HPI [facility] is a 71 y.o. female.  HPI Patient fell yesterday while trying to get something out of her SUV.  She does not recall how she landed but reports that she has a lot of pain in the left wrist.  Did not hit her ahead.  She reports she did twist her right knee somewhat.  She reports however this seems to be more like her usual arthritic pain.  He has been up and ambulatory. Past Medical History:  Diagnosis Date  . ADHD   . Anxiety   . Arthritis   . GERD (gastroesophageal reflux disease)   . Headache   . Thyroid disease   . Vision abnormalities     Patient Active Problem List   Diagnosis Date Noted  . White matter abnormality on MRI of brain 02/26/2016  . Heel pain 10/08/2015  . Awareness of heartbeats 05/15/2015  . Gastro-esophageal reflux disease without esophagitis 04/09/2015  . Peritonitis ([place]) 04/09/2015  . Essential (primary) hypertension 12/12/2014  . Neck pain 10/02/2014  . Other headache syndrome 10/02/2014  . Ataxic gait 10/02/2014  . Numbness 10/02/2014  . Headache disorder 10/02/2014  . LBP (low back pain) 06/12/2014  . OP (osteoporosis) 06/12/2014  . Generalized OA 06/12/2014  . Delayed onset of urination 06/12/2014  . ADD (attention deficit disorder) 06/12/2014    Past Surgical History:  Procedure Laterality Date  . ABDOMINAL HYSTERECTOMY    . BLADDER SURGERY    . BREAST LUMPECTOMY      OB History    No data available       Home Medications    Prior to Admission medications   Medication Sig Start Date End Date Taking? Authorizing Provider  aspirin EC 81 MG tablet Take 81 mg by mouth daily.   Yes [provider]  metoprolol tartrate (LOPRESSOR) 25 MG tablet Take 25 mg by mouth 2 (two) times daily.   Yes [provider]  amphetamine-dextroamphetamine (ADDERALL) 12.5 MG tablet Take 12.5 mg by mouth. 10/22/14   [provider]  bumetanide (BUMEX) 1 MG tablet  07/22/14   [provider]  cyanocobalamin (,VITAMIN B-12,) 1000 MCG/ML injection  07/22/14   [provider]  liothyronine (CYTOMEL) 25 MCG tablet Take 25 mcg by mouth daily. 09/17/14   [provider]  omeprazole (PRILOSEC) 40 MG capsule Take 40 mg by mouth. 12/30/15   [provider]  OXcarbazepine (TRILEPTAL) 150 MG tablet 1 pill in am and 2 pills at night 05/19/16   Sater, Nanine Means, MD  SYNTHROID 100 MCG tablet Take 100 mcg by mouth daily. 09/15/14   [provider]    Family History Family History  Problem Relation Age of Onset  . Dementia Mother   . Lung cancer Father     Social History Social History  Substance Use Topics  . Smoking status: Never Smoker  . Smokeless tobacco: Never Used  . Alcohol use No     Allergies   Gadolinium derivatives; Compazine [prochlorperazine edisylate]; Prochlorperazine; and Iodinated diagnostic agents   Review of Systems Review of Systems Constitutional: No recent fever chills or general illness. Neurologic: No headache confusion or loss of consciousness.  Physical Exam Updated Vital Signs BP 125/75 (BP Location: Right Arm)   Pulse 92  Temp 98.3 F (36.8 C) (Oral)   Resp 20   Ht 5' 4.5" (1.638 m)   Wt 86.2 kg (190 lb)   SpO2 98%   BMI 32.11 kg/m   Physical Exam  Constitutional: She is oriented to person, place, and time.  Alert nontoxic well in appearance with no distress.  HENT:  Head: Normocephalic and atraumatic.  Eyes: EOM are normal.  Pulmonary/Chest: Effort normal.  Musculoskeletal:  Left wrist has chronic older deformity from old fracture.  No significant erythema or swelling.  Pain over the snuffbox area.  No general swelling of the hand. Right knee normal in appearance.  Patient is able to perform range of motion.  She  endorses some tenderness to the medial joint line.  Neurological: She is alert and oriented to person, place, and time. She exhibits normal muscle tone. Coordination normal.  Skin: Skin is warm and dry.  Psychiatric: She has a normal mood and affect.     ED Treatments / Results  Labs (all labs ordered are listed, but only abnormal results are displayed) Labs Reviewed - No data to display  EKG  EKG Interpretation None       Radiology Dg Hand Complete Left  Result Date: 06/26/2017 CLINICAL DATA:  Pt fell backward last night after tripping. Pt c/o L hand pain and R knee pain. Pain along thumb and lateral wrist, hx previous fracture in 1980's wore cast for 3 mths per pt EXAM: LEFT HAND - COMPLETE 3+ VIEW COMPARISON:  None. FINDINGS: There is degenerative change involving the first carpometacarpal joint. No acute fracture for subluxation. IMPRESSION: No evidence for acute abnormality. Moderate degenerative changes at the first carpometacarpal joint. Electronically Signed   By: Nolon Nations M.D.   On: 06/26/2017 12:22    Procedures Procedures (including critical care time)  Medications Ordered in ED Medications - No data to display   Initial Impression / Assessment and Plan / ED Course  I have reviewed the triage vital signs and the nursing notes.  Pertinent labs & imaging results that were available during my care of the patient were reviewed by me and considered in my medical decision making (see chart for details).     Final Clinical Impressions(s) / ED Diagnoses   Final diagnoses:  Sprain of left wrist, initial encounter  Sprain of right knee, unspecified ligament, initial encounter  No acute fracture identified in x-ray.  Patient will be placed in a wrist splint for comfort.  Knee does not appear to have acute injury.  She has been ambulatory and there is no effusion.  She is counseled on the use of acetaminophen plus tramadol.  She does have tramadol at home.  New  Prescriptions New Prescriptions   No medications on file     Charlesetta Shanks, MD 06/26/17 1306

## 2017-06-26 NOTE — ED Triage Notes (Signed)
Pt fell backward last night after tripping. Pt c/o L hand pain and R knee pain. Denies LOC

## 2017-07-01 DIAGNOSIS — M25332 Other instability, left wrist: Secondary | ICD-10-CM | POA: Diagnosis not present

## 2017-07-01 DIAGNOSIS — S52532A Colles' fracture of left radius, initial encounter for closed fracture: Secondary | ICD-10-CM | POA: Diagnosis not present

## 2017-07-01 DIAGNOSIS — M19042 Primary osteoarthritis, left hand: Secondary | ICD-10-CM | POA: Diagnosis not present

## 2017-07-05 DIAGNOSIS — M19042 Primary osteoarthritis, left hand: Secondary | ICD-10-CM | POA: Diagnosis not present

## 2017-07-27 DIAGNOSIS — S52532A Colles' fracture of left radius, initial encounter for closed fracture: Secondary | ICD-10-CM | POA: Diagnosis not present

## 2017-07-27 DIAGNOSIS — M19042 Primary osteoarthritis, left hand: Secondary | ICD-10-CM | POA: Diagnosis not present

## 2017-07-27 DIAGNOSIS — M25332 Other instability, left wrist: Secondary | ICD-10-CM | POA: Diagnosis not present

## 2017-07-27 DIAGNOSIS — S52532D Colles' fracture of left radius, subsequent encounter for closed fracture with routine healing: Secondary | ICD-10-CM | POA: Diagnosis not present

## 2017-07-27 DIAGNOSIS — M1812 Unilateral primary osteoarthritis of first carpometacarpal joint, left hand: Secondary | ICD-10-CM | POA: Diagnosis not present

## 2017-08-03 DIAGNOSIS — H524 Presbyopia: Secondary | ICD-10-CM | POA: Diagnosis not present

## 2017-08-11 DIAGNOSIS — I10 Essential (primary) hypertension: Secondary | ICD-10-CM | POA: Diagnosis not present

## 2017-08-11 DIAGNOSIS — E039 Hypothyroidism, unspecified: Secondary | ICD-10-CM | POA: Diagnosis not present

## 2017-08-11 DIAGNOSIS — K219 Gastro-esophageal reflux disease without esophagitis: Secondary | ICD-10-CM | POA: Diagnosis not present

## 2017-08-11 DIAGNOSIS — M159 Polyosteoarthritis, unspecified: Secondary | ICD-10-CM | POA: Diagnosis not present

## 2017-08-17 DIAGNOSIS — M19042 Primary osteoarthritis, left hand: Secondary | ICD-10-CM | POA: Diagnosis not present

## 2017-08-17 DIAGNOSIS — M1812 Unilateral primary osteoarthritis of first carpometacarpal joint, left hand: Secondary | ICD-10-CM | POA: Diagnosis not present

## 2017-08-17 DIAGNOSIS — S52532D Colles' fracture of left radius, subsequent encounter for closed fracture with routine healing: Secondary | ICD-10-CM | POA: Diagnosis not present

## 2017-08-17 DIAGNOSIS — M659 Synovitis and tenosynovitis, unspecified: Secondary | ICD-10-CM | POA: Diagnosis not present

## 2017-10-04 DIAGNOSIS — M19042 Primary osteoarthritis, left hand: Secondary | ICD-10-CM | POA: Diagnosis not present

## 2017-10-04 DIAGNOSIS — S52592D Other fractures of lower end of left radius, subsequent encounter for closed fracture with routine healing: Secondary | ICD-10-CM | POA: Diagnosis not present

## 2017-10-07 DIAGNOSIS — M19042 Primary osteoarthritis, left hand: Secondary | ICD-10-CM | POA: Diagnosis not present

## 2017-11-21 DIAGNOSIS — I10 Essential (primary) hypertension: Secondary | ICD-10-CM | POA: Diagnosis not present

## 2017-11-21 DIAGNOSIS — M150 Primary generalized (osteo)arthritis: Secondary | ICD-10-CM | POA: Diagnosis not present

## 2017-11-21 DIAGNOSIS — F988 Other specified behavioral and emotional disorders with onset usually occurring in childhood and adolescence: Secondary | ICD-10-CM | POA: Diagnosis not present

## 2017-11-21 DIAGNOSIS — E039 Hypothyroidism, unspecified: Secondary | ICD-10-CM | POA: Diagnosis not present

## 2018-02-21 DIAGNOSIS — M150 Primary generalized (osteo)arthritis: Secondary | ICD-10-CM | POA: Diagnosis not present

## 2018-02-21 DIAGNOSIS — I10 Essential (primary) hypertension: Secondary | ICD-10-CM | POA: Diagnosis not present

## 2018-02-21 DIAGNOSIS — K219 Gastro-esophageal reflux disease without esophagitis: Secondary | ICD-10-CM | POA: Diagnosis not present

## 2018-02-21 DIAGNOSIS — E039 Hypothyroidism, unspecified: Secondary | ICD-10-CM | POA: Diagnosis not present

## 2018-03-23 DIAGNOSIS — E079 Disorder of thyroid, unspecified: Secondary | ICD-10-CM | POA: Diagnosis not present

## 2018-04-21 DIAGNOSIS — E039 Hypothyroidism, unspecified: Secondary | ICD-10-CM | POA: Diagnosis not present

## 2018-05-25 DIAGNOSIS — M25561 Pain in right knee: Secondary | ICD-10-CM | POA: Diagnosis not present

## 2018-05-25 DIAGNOSIS — G8929 Other chronic pain: Secondary | ICD-10-CM | POA: Diagnosis not present

## 2018-05-25 DIAGNOSIS — F988 Other specified behavioral and emotional disorders with onset usually occurring in childhood and adolescence: Secondary | ICD-10-CM | POA: Diagnosis not present

## 2018-05-25 DIAGNOSIS — E538 Deficiency of other specified B group vitamins: Secondary | ICD-10-CM | POA: Diagnosis not present

## 2018-05-25 DIAGNOSIS — M159 Polyosteoarthritis, unspecified: Secondary | ICD-10-CM | POA: Diagnosis not present

## 2018-05-25 DIAGNOSIS — Z23 Encounter for immunization: Secondary | ICD-10-CM | POA: Diagnosis not present

## 2018-05-25 DIAGNOSIS — Z0000 Encounter for general adult medical examination without abnormal findings: Secondary | ICD-10-CM | POA: Diagnosis not present

## 2018-06-06 DIAGNOSIS — M7062 Trochanteric bursitis, left hip: Secondary | ICD-10-CM | POA: Diagnosis not present

## 2018-06-06 DIAGNOSIS — M7051 Other bursitis of knee, right knee: Secondary | ICD-10-CM | POA: Diagnosis not present

## 2018-06-06 DIAGNOSIS — M65331 Trigger finger, right middle finger: Secondary | ICD-10-CM | POA: Diagnosis not present

## 2018-06-13 DIAGNOSIS — E039 Hypothyroidism, unspecified: Secondary | ICD-10-CM | POA: Diagnosis not present

## 2018-07-19 DIAGNOSIS — Z1231 Encounter for screening mammogram for malignant neoplasm of breast: Secondary | ICD-10-CM | POA: Diagnosis not present

## 2018-08-08 DIAGNOSIS — H524 Presbyopia: Secondary | ICD-10-CM | POA: Diagnosis not present

## 2018-08-08 DIAGNOSIS — H0288A Meibomian gland dysfunction right eye, upper and lower eyelids: Secondary | ICD-10-CM | POA: Diagnosis not present

## 2018-08-08 DIAGNOSIS — H02834 Dermatochalasis of left upper eyelid: Secondary | ICD-10-CM | POA: Diagnosis not present

## 2018-08-08 DIAGNOSIS — H02831 Dermatochalasis of right upper eyelid: Secondary | ICD-10-CM | POA: Diagnosis not present

## 2018-08-08 DIAGNOSIS — H43393 Other vitreous opacities, bilateral: Secondary | ICD-10-CM | POA: Diagnosis not present

## 2018-08-08 DIAGNOSIS — H04123 Dry eye syndrome of bilateral lacrimal glands: Secondary | ICD-10-CM | POA: Diagnosis not present

## 2018-08-08 DIAGNOSIS — H0100A Unspecified blepharitis right eye, upper and lower eyelids: Secondary | ICD-10-CM | POA: Diagnosis not present

## 2018-08-08 DIAGNOSIS — Z961 Presence of intraocular lens: Secondary | ICD-10-CM | POA: Diagnosis not present

## 2018-08-08 DIAGNOSIS — H0288B Meibomian gland dysfunction left eye, upper and lower eyelids: Secondary | ICD-10-CM | POA: Diagnosis not present

## 2018-08-08 DIAGNOSIS — H5203 Hypermetropia, bilateral: Secondary | ICD-10-CM | POA: Diagnosis not present

## 2018-08-08 DIAGNOSIS — H0100B Unspecified blepharitis left eye, upper and lower eyelids: Secondary | ICD-10-CM | POA: Diagnosis not present

## 2018-08-08 DIAGNOSIS — H52203 Unspecified astigmatism, bilateral: Secondary | ICD-10-CM | POA: Diagnosis not present

## 2018-08-08 DIAGNOSIS — D3131 Benign neoplasm of right choroid: Secondary | ICD-10-CM | POA: Diagnosis not present

## 2018-08-31 DIAGNOSIS — F988 Other specified behavioral and emotional disorders with onset usually occurring in childhood and adolescence: Secondary | ICD-10-CM | POA: Diagnosis not present

## 2018-11-09 ENCOUNTER — Emergency Department (HOSPITAL_BASED_OUTPATIENT_CLINIC_OR_DEPARTMENT_OTHER): Payer: PPO

## 2018-11-09 ENCOUNTER — Encounter (HOSPITAL_BASED_OUTPATIENT_CLINIC_OR_DEPARTMENT_OTHER): Payer: Self-pay | Admitting: Emergency Medicine

## 2018-11-09 ENCOUNTER — Other Ambulatory Visit: Payer: Self-pay

## 2018-11-09 ENCOUNTER — Emergency Department (HOSPITAL_BASED_OUTPATIENT_CLINIC_OR_DEPARTMENT_OTHER)
Admission: EM | Admit: 2018-11-09 | Discharge: 2018-11-09 | Disposition: A | Discharge status: Home / Self Care | Payer: PPO | Attending: Emergency Medicine | Admitting: Emergency Medicine

## 2018-11-09 DIAGNOSIS — I10 Essential (primary) hypertension: Secondary | ICD-10-CM | POA: Diagnosis not present

## 2018-11-09 DIAGNOSIS — R05 Cough: Secondary | ICD-10-CM | POA: Diagnosis not present

## 2018-11-09 DIAGNOSIS — Z79899 Other long term (current) drug therapy: Secondary | ICD-10-CM | POA: Insufficient documentation

## 2018-11-09 DIAGNOSIS — J111 Influenza due to unidentified influenza virus with other respiratory manifestations: Secondary | ICD-10-CM | POA: Insufficient documentation

## 2018-11-09 DIAGNOSIS — R69 Illness, unspecified: Secondary | ICD-10-CM

## 2018-11-09 MED ORDER — GUAIFENESIN 100 MG/5ML PO SYRP: 100.0000 mg | ORAL_SOLUTION | ORAL | 0 refills | Status: AC | PRN

## 2018-11-09 MED ORDER — FLUTICASONE PROPIONATE 50 MCG/ACT NA SUSP: 1.0000 | Freq: Every day | NASAL | 0 refills | Status: AC

## 2018-11-09 NOTE — ED Provider Notes (Signed)
[facility] EMERGENCY DEPARTMENT Provider Note   CSN: [id number] Arrival date & time: 11/09/18  1358  History   Chief Complaint Chief Complaint  Patient presents with   Cough    HPI [facility] is a 73 y.o. female with past medical history significant for arthritis, anxiety, chronic headache, chronic low back pain who presents for evaluation of flulike symptoms.  Patient states she has had nonproductive cough, congestion, rhinorrhea, sore throat, body aches and pains since Monday, 4 days PTA.  Patient states she was running a low-grade temperature yesterday, 99.1.  States she did take ibuprofen with mild relief of her body aches and pains.  Has not taken any medications in the last 24 hours PTA.  States yesterday she did have one episode of NBNB emesis as well as 2 episodes of nonbloody diarrhea.  Denies headache, vision changes, neck pain, neck stiffness, drooling, dysphasia, trismus, speech, unilateral weakness, chest pain, shortness of breath, dysuria, constipation, pelvic pain.  Denies additional aggravating or alleviating factors.  Patient states her husband was recently hospitalized approximately 3 weeks ago for pneumonia.  Patient states that has been started to feel "sick" at the same time as her on Monday.  Of note, patient's husband is in the ED being evaluated for similar symptoms. Has been able to tolerate PO intake without difficulty.  Denies recent travel or exposure to coronavirus positive patients.  History obtained from patient.  No interpreter was used.     HPI  Past Medical History:  Diagnosis Date   ADHD    Anxiety    Arthritis    GERD (gastroesophageal reflux disease)    Headache    Thyroid disease    Vision abnormalities     Patient Active Problem List   Diagnosis Date Noted   White matter abnormality on MRI of brain 02/26/2016   Heel pain 10/08/2015   Awareness of heartbeats 05/15/2015   Gastro-esophageal reflux disease without  esophagitis 04/09/2015   Peritonitis ([place]) 04/09/2015   Essential (primary) hypertension 12/12/2014   Neck pain 10/02/2014   Other headache syndrome 10/02/2014   Ataxic gait 10/02/2014   Numbness 10/02/2014   Headache disorder 10/02/2014   LBP (low back pain) 06/12/2014   OP (osteoporosis) 06/12/2014   Generalized OA 06/12/2014   Delayed onset of urination 06/12/2014   ADD (attention deficit disorder) 06/12/2014    Past Surgical History:  Procedure Laterality Date   ABDOMINAL HYSTERECTOMY     BLADDER SURGERY     BREAST LUMPECTOMY       OB History   No obstetric history on file.      Home Medications    Prior to Admission medications   Medication Sig Start Date End Date Taking? Authorizing Provider  amphetamine-dextroamphetamine (ADDERALL) 12.5 MG tablet Take 12.5 mg by mouth. 10/22/14   [provider]  aspirin EC 81 MG tablet Take 81 mg by mouth daily.    [provider]  bumetanide (BUMEX) 1 MG tablet  07/22/14   [provider]  cyanocobalamin (,VITAMIN B-12,) 1000 MCG/ML injection  07/22/14   [provider]  fluticasone (FLONASE) 50 MCG/ACT nasal spray Place 1 spray into both nostrils daily. 11/09/18   Pinchus Weckwerth A, PA-C  guaifenesin (ROBITUSSIN) 100 MG/5ML syrup Take 5-10 mLs (100-200 mg total) by mouth every 4 (four) hours as needed for cough. 11/09/18   Wallis Vancott A, PA-C  liothyronine (CYTOMEL) 25 MCG tablet Take 25 mcg by mouth daily. 09/17/14   [provider]  metoprolol tartrate (LOPRESSOR) 25 MG tablet Take 25 mg by mouth 2 (two) times daily.    [provider]  omeprazole (PRILOSEC) 40 MG capsule Take 40 mg by mouth. 12/30/15   [provider]  OXcarbazepine (TRILEPTAL) 150 MG tablet 1 pill in am and 2 pills at night 05/19/16   Sater, Nanine Means, MD  SYNTHROID 100 MCG tablet Take 100 mcg by mouth daily. 09/15/14   [provider]    Family History Family History    Problem Relation Age of Onset   Dementia Mother    Lung cancer Father     Social History Social History   Tobacco Use   Smoking status: Never Smoker   Smokeless tobacco: Never Used  Substance Use Topics   Alcohol use: No    Alcohol/week: 0.0 standard drinks   Drug use: No     Allergies   Gadolinium derivatives; Compazine [prochlorperazine edisylate]; Prochlorperazine; and Iodinated diagnostic agents   Review of Systems Review of Systems  Constitutional:       Low Grade fever.  HENT: Positive for congestion, postnasal drip, rhinorrhea and sore throat. Negative for ear discharge, ear pain, sinus pressure, sinus pain, sneezing, tinnitus, trouble swallowing and voice change.   Eyes: Negative.   Respiratory: Positive for cough. Negative for apnea, choking, chest tightness, shortness of breath, wheezing and stridor.   Cardiovascular: Negative.   Gastrointestinal: Positive for diarrhea, nausea, rectal pain and vomiting. Negative for abdominal distention, abdominal pain, anal bleeding, blood in stool and constipation.  Genitourinary: Negative.   Musculoskeletal: Negative.        Body aches and pains.  Skin: Negative.   Neurological: Negative.   All other systems reviewed and are negative.    Physical Exam Updated Vital Signs BP 122/85 (BP Location: Left Arm)    Pulse 95    Temp 97.9 F (36.6 C) (Oral)    Resp 20    Ht 5\' 4"  (1.626 m)    Wt 86.2 kg    SpO2 95%    BMI 32.61 kg/m   Physical Exam Vitals signs and nursing note reviewed.  Constitutional:      General: She is not in acute distress.    Appearance: She is well-developed. She is not ill-appearing, toxic-appearing or diaphoretic.     Comments: Sitting in chair looking at cell phone.  Does not appear in any acute distress.  HENT:     Head: Normocephalic and atraumatic.     Right Ear: Tympanic membrane, ear canal and external ear normal. There is no impacted cerumen.     Left Ear: Tympanic membrane, ear  canal and external ear normal. There is no impacted cerumen.     Nose:     Comments: Clear rhinorrhea and congestion to bilateral nares.    Mouth/Throat:     Comments: Posterior oropharynx clear.  Mucous membranes moist.  Uvula midline without deviation.  No evidence of tonsillar edema or exudate.  No evidence of PTA or RPA.  No drooling, dysphasia or trismus. Eyes:     Pupils: Pupils are equal, round, and reactive to light.  Neck:     Musculoskeletal: Normal range of motion.  Cardiovascular:     Rate and Rhythm: Normal rate.     Pulses: Normal pulses.     Heart sounds: Normal heart sounds.  Pulmonary:     Effort: No respiratory distress.     Comments: Clear to auscultation bilateral without wheeze, rhonchi or rales.  No accessory muscle usage.  Able to speak in full sentences without difficulty. Abdominal:     General: There is no distension.     Comments: Soft, nontender without rebound or guarding.  Normoactive bowel sounds.  No CVA tenderness.  Musculoskeletal: Normal range of motion.     Comments: Moves all 4 extremities without difficulty.  No lower extremity edema.  Ambultory in department without difficulty.  She does have diffuse lower extremity back pain, however she states this is chronic in nature. No midline back pain.  Skin:    General: Skin is warm and dry.     Comments: No erythema, edema, ecchymosis or warmth.  No rashes or lesions.  Brisk capillary refill.  Neurological:     Mental Status: She is alert.     Comments: No facial droop.  No unilateral weakness.  Cranial nerves II through XII grossly intact.      ED Treatments / Results  Labs (all labs ordered are listed, but only abnormal results are displayed) Labs Reviewed - No data to display  EKG None  Radiology Dg Chest 2 View  Result Date: 11/09/2018 CLINICAL DATA:  Cough and fever for several days. EXAM: CHEST - 2 VIEW COMPARISON:  01/09/2015 FINDINGS: The cardiomediastinal silhouette is unremarkable.  There is no evidence of focal airspace disease, pulmonary edema, suspicious pulmonary nodule/mass, pleural effusion, or pneumothorax. No acute bony abnormalities are identified. IMPRESSION: No active cardiopulmonary disease. Electronically Signed   By: Margarette Canada M.D.   On: 11/09/2018 14:39    Procedures Procedures (including critical care time)  Medications Ordered in ED Medications - No data to display   Initial Impression / Assessment and Plan / ED Course  I have reviewed the triage vital signs and the nursing notes.  Pertinent labs & imaging results that were available during my care of the patient were reviewed by me and considered in my medical decision making (see chart for details).  73 year old female appears otherwise well presents for evaluation of flulike symptoms.  Afebrile, nonseptic, non-ill-appearing.  Sx onset Monday, 4 days PTA.  Did receive flu vaccine in September.  Husband been has similar symptoms.  Clear to auscultation bilateral without wheeze, rhonchi or rales.  No accessory muscle usage.  No tachypnea, tachycardia or hypoxia.  Chest x-ray negative for infiltrates, cardiomegaly, pneumothorax, pulmonary edema.  Posterior oropharynx clear.  Uvula midline without deviation.  No tonsillar edema or tonsillar exudate.  No PTA, RPA, drooling, dysphasia or trismus.  Low suspicion for pharyngitis.  Ears without evidence of otitis.  No lower extremity edema, erythema, ecchymosis or warmth, low suspicion for cardiac cause of cough.  No neck stiffness or neck rigidity, no meningismus, low suspicion for meningitis.  She does have diffuse tenderness to her lower back on exam, however patient states is chronic in nature.  No midline back pain or CVA tenderness with urinary sx to suggest pyelonephritis.  No bowel or bladder incontinence, saddle paresthesia, numbness or tingling her extremities, low suspicion for cauda equina.  Patient symptoms consistent with influenza.  Patient understands  she is outside treatment window for Tamiflu.  Discussed symptomatic management.  She does not meet Sirs or sepsis criteria.  Patient hemodynamically stable and appropriate for DC home at this time.  I discussed strict return precautions with patient.  Patient voiced understanding and is agreeable to follow-up.  Patient has been seen and evaluated by my attending, Dr. Reather Converse who agrees with above treatment, plan and disposition.     Final Clinical Impressions(s) / ED Diagnoses  Final diagnoses:  Influenza-like illness    ED Discharge Orders         Ordered    guaifenesin (ROBITUSSIN) 100 MG/5ML syrup  Every 4 hours PRN     11/09/18 1534    fluticasone (FLONASE) 50 MCG/ACT nasal spray  Daily     11/09/18 1534           Sharnee Douglass A, PA-C 11/09/18 1538    Elnora Morrison, MD 11/09/18 1551

## 2018-11-09 NOTE — Discharge Instructions (Addendum)
You were evaluated today for flulike illness.  Chest x-ray was negative for pneumonia.  I have given you a nasal spray as well as a cough medication.  Please take as prescribed.  You are outside treatment window for Tamiflu, which we traditionally give for influenza patients.  May continue to take Tylenol and ibuprofen as needed for body aches and pains or fever.  Please try to limit your exposure to others until you are 24 hours without fever without using Tylenol or Ibuprofen.  Follow-up with PCP for reevaluation over the next 2 days if you continue to have symptoms.  Return to the ED for any new or worsening symptoms.

## 2018-11-09 NOTE — ED Triage Notes (Signed)
Reports cough with fever which began Monday.

## 2018-12-04 DIAGNOSIS — M19042 Primary osteoarthritis, left hand: Secondary | ICD-10-CM | POA: Diagnosis not present

## 2018-12-04 DIAGNOSIS — I10 Essential (primary) hypertension: Secondary | ICD-10-CM | POA: Diagnosis not present

## 2018-12-04 DIAGNOSIS — M150 Primary generalized (osteo)arthritis: Secondary | ICD-10-CM | POA: Diagnosis not present

## 2018-12-04 DIAGNOSIS — F988 Other specified behavioral and emotional disorders with onset usually occurring in childhood and adolescence: Secondary | ICD-10-CM | POA: Diagnosis not present

## 2018-12-04 DIAGNOSIS — M19041 Primary osteoarthritis, right hand: Secondary | ICD-10-CM | POA: Diagnosis not present

## 2018-12-13 DIAGNOSIS — K219 Gastro-esophageal reflux disease without esophagitis: Secondary | ICD-10-CM | POA: Diagnosis not present

## 2019-01-25 DIAGNOSIS — E039 Hypothyroidism, unspecified: Secondary | ICD-10-CM | POA: Diagnosis not present

## 2019-02-16 DIAGNOSIS — I10 Essential (primary) hypertension: Secondary | ICD-10-CM | POA: Diagnosis not present

## 2019-04-05 DIAGNOSIS — E038 Other specified hypothyroidism: Secondary | ICD-10-CM | POA: Diagnosis not present

## 2019-04-13 DIAGNOSIS — F988 Other specified behavioral and emotional disorders with onset usually occurring in childhood and adolescence: Secondary | ICD-10-CM | POA: Diagnosis not present

## 2019-04-13 DIAGNOSIS — F419 Anxiety disorder, unspecified: Secondary | ICD-10-CM | POA: Diagnosis not present

## 2019-04-13 DIAGNOSIS — M8949 Other hypertrophic osteoarthropathy, multiple sites: Secondary | ICD-10-CM | POA: Diagnosis not present

## 2019-04-13 DIAGNOSIS — K219 Gastro-esophageal reflux disease without esophagitis: Secondary | ICD-10-CM | POA: Diagnosis not present

## 2019-04-13 DIAGNOSIS — I10 Essential (primary) hypertension: Secondary | ICD-10-CM | POA: Diagnosis not present

## 2019-04-13 DIAGNOSIS — M159 Polyosteoarthritis, unspecified: Secondary | ICD-10-CM | POA: Diagnosis not present

## 2019-05-03 DIAGNOSIS — M79641 Pain in right hand: Secondary | ICD-10-CM | POA: Diagnosis not present

## 2019-05-03 DIAGNOSIS — M19042 Primary osteoarthritis, left hand: Secondary | ICD-10-CM | POA: Diagnosis not present

## 2019-05-03 DIAGNOSIS — M19041 Primary osteoarthritis, right hand: Secondary | ICD-10-CM | POA: Diagnosis not present

## 2019-05-03 DIAGNOSIS — M79642 Pain in left hand: Secondary | ICD-10-CM | POA: Diagnosis not present

## 2019-05-03 DIAGNOSIS — M65331 Trigger finger, right middle finger: Secondary | ICD-10-CM | POA: Diagnosis not present

## 2019-05-03 DIAGNOSIS — E038 Other specified hypothyroidism: Secondary | ICD-10-CM | POA: Diagnosis not present

## 2019-05-04 DIAGNOSIS — Z20828 Contact with and (suspected) exposure to other viral communicable diseases: Secondary | ICD-10-CM | POA: Diagnosis not present

## 2019-05-04 DIAGNOSIS — R509 Fever, unspecified: Secondary | ICD-10-CM | POA: Diagnosis not present

## 2019-05-04 DIAGNOSIS — R51 Headache: Secondary | ICD-10-CM | POA: Diagnosis not present

## 2019-05-04 DIAGNOSIS — J029 Acute pharyngitis, unspecified: Secondary | ICD-10-CM | POA: Diagnosis not present

## 2019-05-04 DIAGNOSIS — J069 Acute upper respiratory infection, unspecified: Secondary | ICD-10-CM | POA: Diagnosis not present

## 2019-05-04 DIAGNOSIS — M791 Myalgia, unspecified site: Secondary | ICD-10-CM | POA: Diagnosis not present

## 2019-05-04 DIAGNOSIS — R0981 Nasal congestion: Secondary | ICD-10-CM | POA: Diagnosis not present

## 2019-07-11 IMAGING — CR DG HAND COMPLETE 3+V*L*
3 series · 3 of 3 positions shown · non-contrast
Comparison: None.

CLINICAL DATA: Pt fell backward last night after tripping. Pt c/o L
hand pain and R knee pain. Pain along thumb and lateral wrist, hx
previous fracture in 8315's wore cast for 3 mths per pt

EXAM:
LEFT HAND - COMPLETE 3+ VIEW

[x hand pa left]
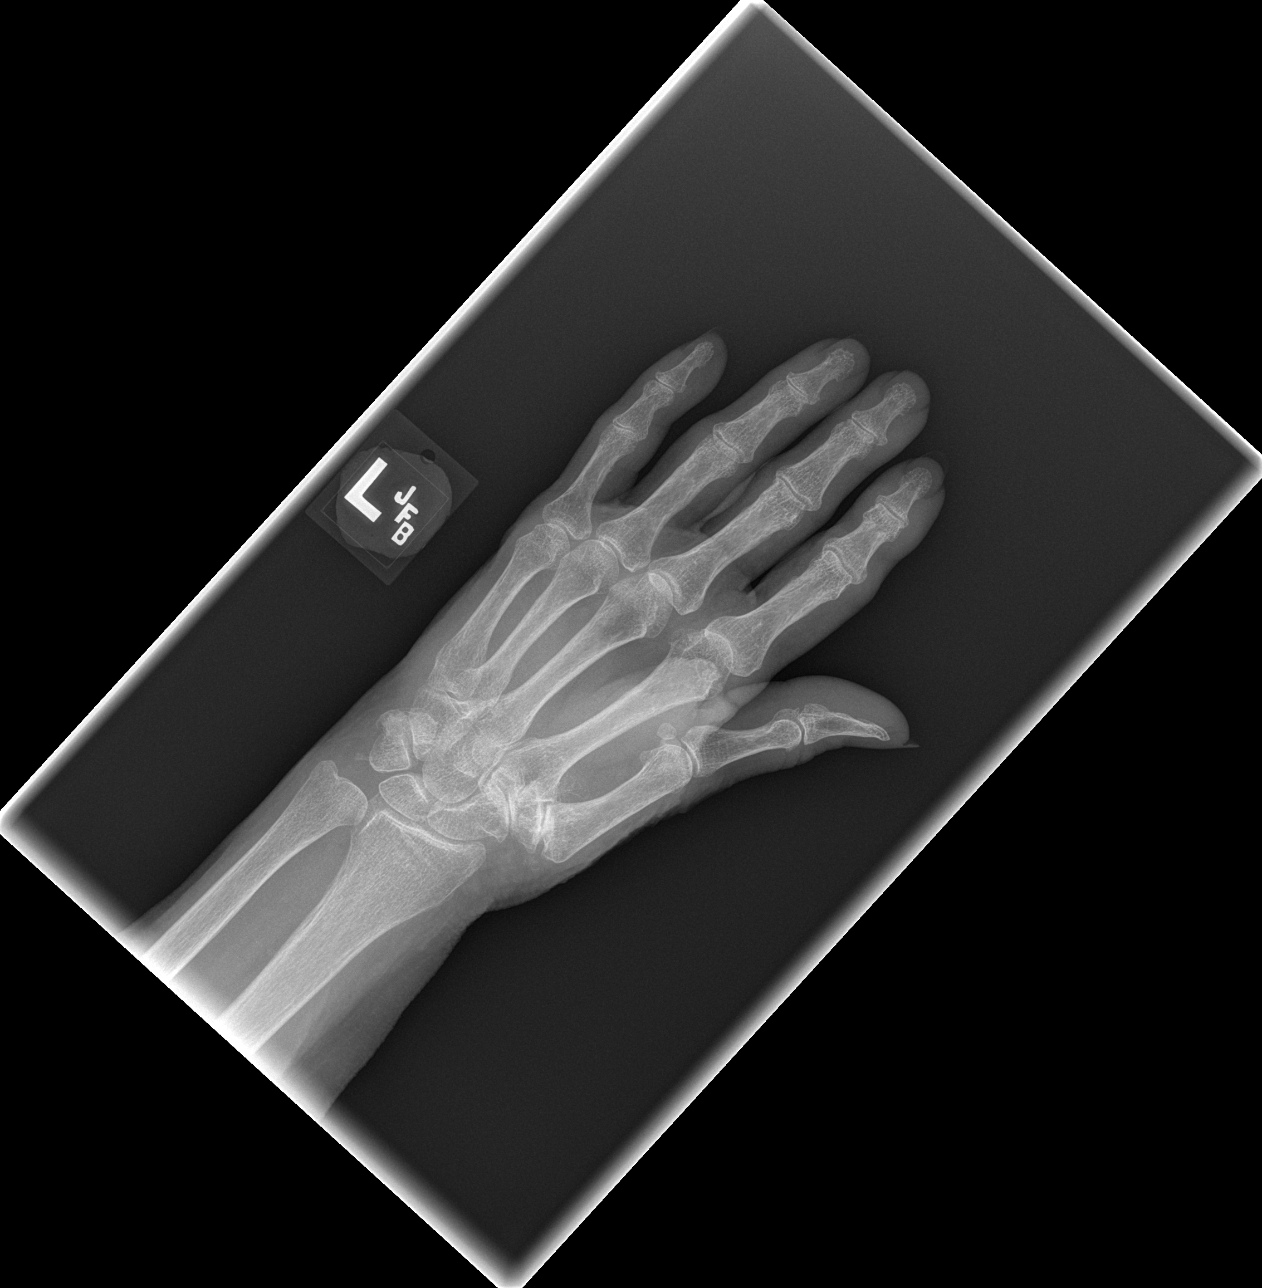

[x hand oblique left]
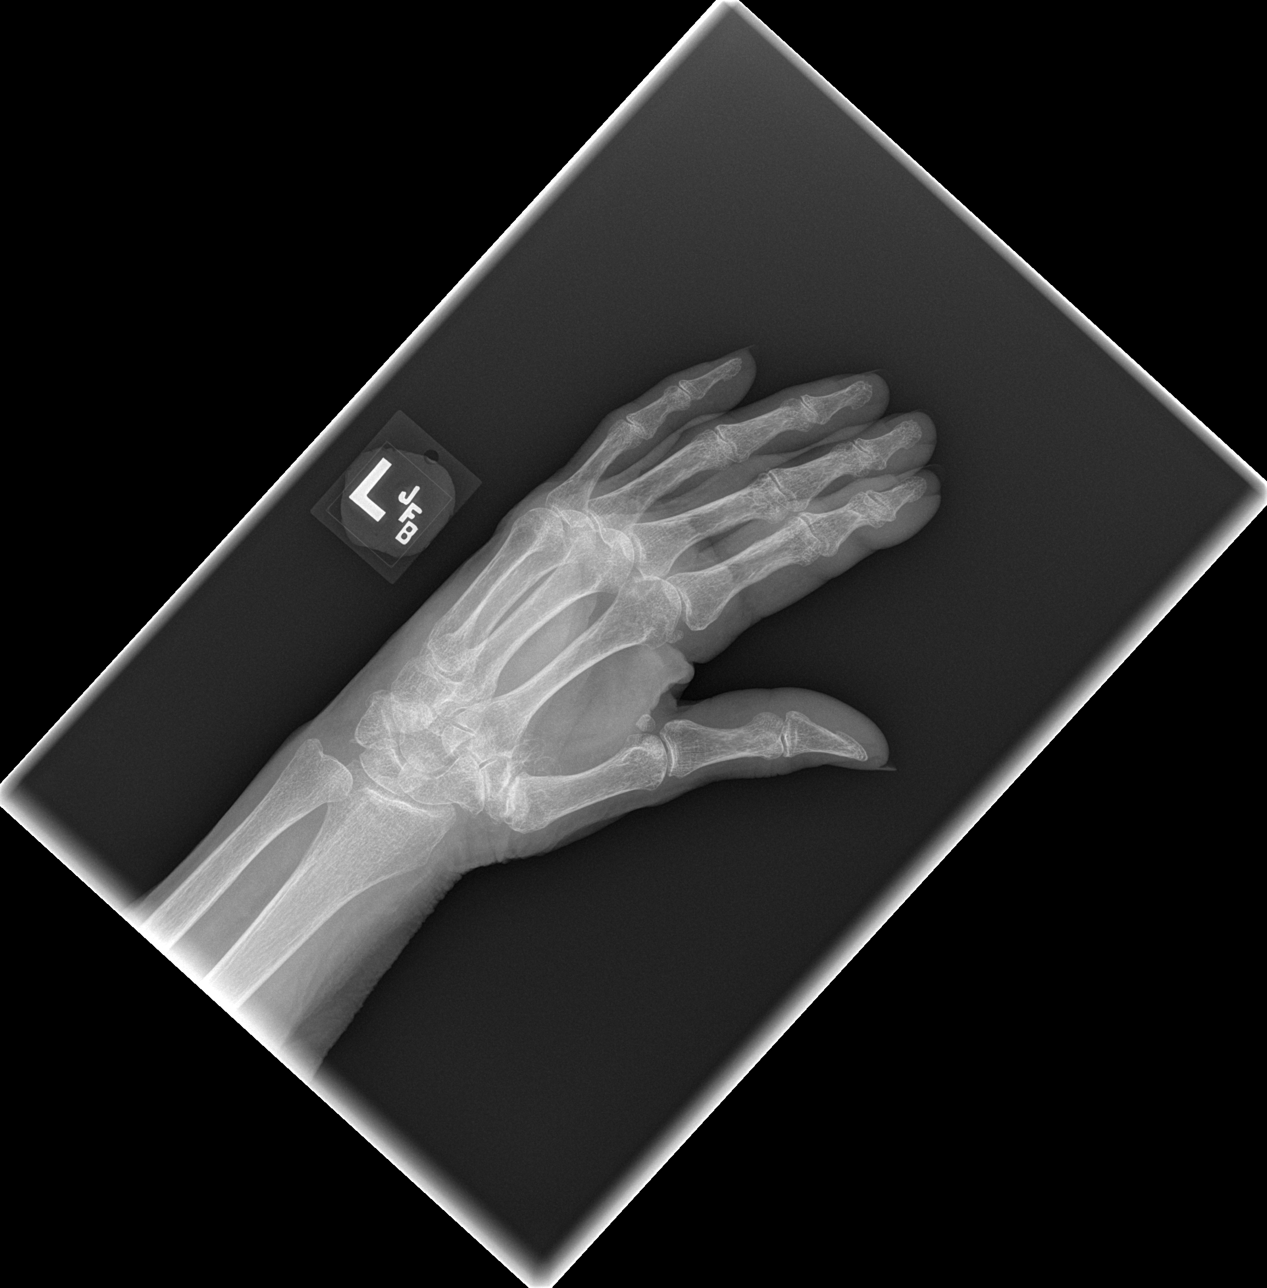

[x hand lat left]
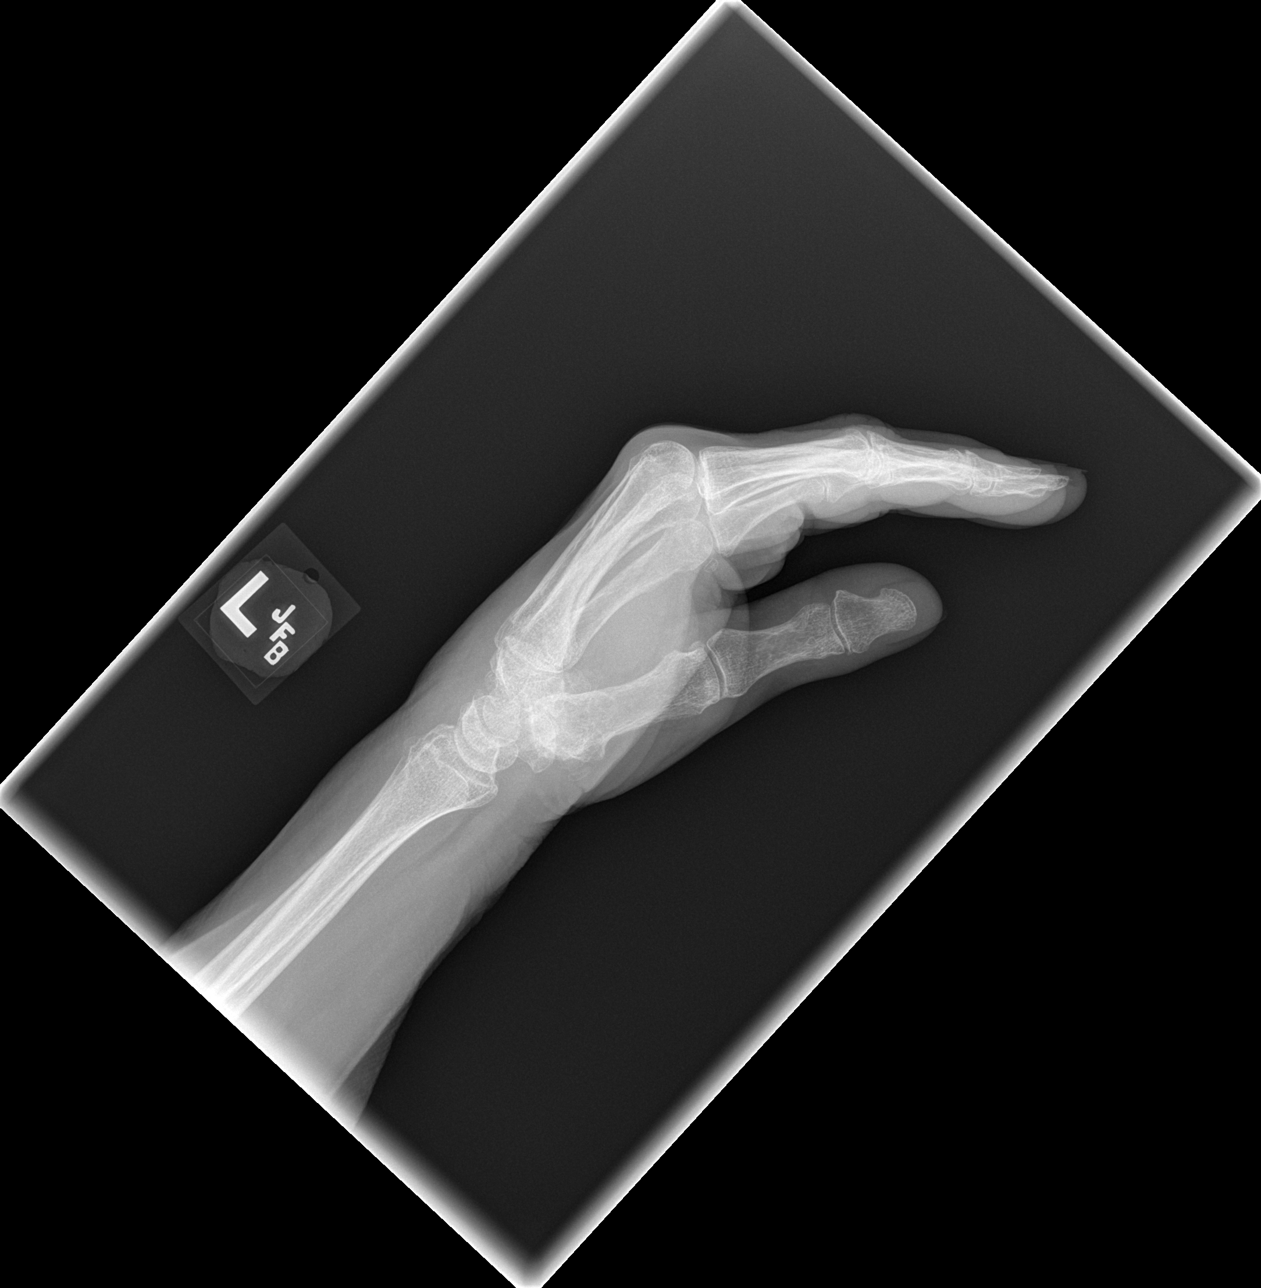

[3 of 3 positions shown; findings below may reference images not displayed]

FINDINGS: There is degenerative change involving the first carpometacarpal
joint. No acute fracture for subluxation.
IMPRESSION: No evidence for acute abnormality. Moderate degenerative changes at
the first carpometacarpal joint.

## 2019-08-17 DIAGNOSIS — Z0000 Encounter for general adult medical examination without abnormal findings: Secondary | ICD-10-CM | POA: Diagnosis not present

## 2019-08-17 DIAGNOSIS — E785 Hyperlipidemia, unspecified: Secondary | ICD-10-CM | POA: Diagnosis not present

## 2019-08-17 DIAGNOSIS — F988 Other specified behavioral and emotional disorders with onset usually occurring in childhood and adolescence: Secondary | ICD-10-CM | POA: Diagnosis not present

## 2019-08-17 DIAGNOSIS — I10 Essential (primary) hypertension: Secondary | ICD-10-CM | POA: Diagnosis not present

## 2019-10-08 ENCOUNTER — Ambulatory Visit: Payer: PPO | Attending: Internal Medicine

## 2019-10-08 DIAGNOSIS — Z23 Encounter for immunization: Secondary | ICD-10-CM

## 2019-10-08 NOTE — Progress Notes (Signed)
   Covid-19 Vaccination Clinic  Name:  [facility]    MRN: [id number] DOB: [birth year]  10/08/2019  [name] was observed post Covid-19 immunization for 15 minutes without incidence. She was provided with Vaccine Information Sheet and instruction to access the V-Safe system.   [name] was instructed to call 911 with any severe reactions post vaccine: Marland Kitchen Difficulty breathing  . Swelling of your face and throat  . A fast heartbeat  . A bad rash all over your body  . Dizziness and weakness    Immunizations Administered    Name Date Dose VIS Date Route   Pfizer COVID-19 Vaccine 10/08/2019 11:48 AM 0.3 mL 08/10/2019 Intramuscular   Manufacturer: [place]   Lot: [id number]   [place]: [id number]

## 2019-10-11 DIAGNOSIS — H0288B Meibomian gland dysfunction left eye, upper and lower eyelids: Secondary | ICD-10-CM | POA: Diagnosis not present

## 2019-10-11 DIAGNOSIS — H0100B Unspecified blepharitis left eye, upper and lower eyelids: Secondary | ICD-10-CM | POA: Diagnosis not present

## 2019-10-11 DIAGNOSIS — Z961 Presence of intraocular lens: Secondary | ICD-10-CM | POA: Diagnosis not present

## 2019-10-11 DIAGNOSIS — H5203 Hypermetropia, bilateral: Secondary | ICD-10-CM | POA: Diagnosis not present

## 2019-10-11 DIAGNOSIS — H04123 Dry eye syndrome of bilateral lacrimal glands: Secondary | ICD-10-CM | POA: Diagnosis not present

## 2019-10-11 DIAGNOSIS — H02831 Dermatochalasis of right upper eyelid: Secondary | ICD-10-CM | POA: Diagnosis not present

## 2019-10-11 DIAGNOSIS — H02834 Dermatochalasis of left upper eyelid: Secondary | ICD-10-CM | POA: Diagnosis not present

## 2019-10-11 DIAGNOSIS — H43393 Other vitreous opacities, bilateral: Secondary | ICD-10-CM | POA: Diagnosis not present

## 2019-10-11 DIAGNOSIS — H0100A Unspecified blepharitis right eye, upper and lower eyelids: Secondary | ICD-10-CM | POA: Diagnosis not present

## 2019-10-11 DIAGNOSIS — H0288A Meibomian gland dysfunction right eye, upper and lower eyelids: Secondary | ICD-10-CM | POA: Diagnosis not present

## 2019-10-11 DIAGNOSIS — H524 Presbyopia: Secondary | ICD-10-CM | POA: Diagnosis not present

## 2019-10-11 DIAGNOSIS — H52203 Unspecified astigmatism, bilateral: Secondary | ICD-10-CM | POA: Diagnosis not present

## 2019-11-01 ENCOUNTER — Ambulatory Visit: Payer: PPO | Attending: Internal Medicine

## 2019-11-01 DIAGNOSIS — Z23 Encounter for immunization: Secondary | ICD-10-CM | POA: Insufficient documentation

## 2019-11-01 NOTE — Progress Notes (Signed)
   Covid-19 Vaccination Clinic  Name:  [facility]    MRN: [id number] DOB: 07/09/1946  11/01/2019  [name] was observed post Covid-19 immunization for 15 minutes without incident. She was provided with Vaccine Information Sheet and instruction to access the V-Safe system.   [name] was instructed to call 911 with any severe reactions post vaccine: Marland Kitchen Difficulty breathing  . Swelling of face and throat  . A fast heartbeat  . A bad rash all over body  . Dizziness and weakness   Immunizations Administered    Name Date Dose VIS Date Route   Pfizer COVID-19 Vaccine 11/01/2019  2:35 PM 0.3 mL 08/10/2019 Intramuscular   Manufacturer: [place]   Lot: [id number]   [place]: [id number]

## 2019-11-12 DIAGNOSIS — M545 Low back pain: Secondary | ICD-10-CM | POA: Diagnosis not present

## 2019-11-12 DIAGNOSIS — E538 Deficiency of other specified B group vitamins: Secondary | ICD-10-CM | POA: Diagnosis not present

## 2019-11-12 DIAGNOSIS — G8929 Other chronic pain: Secondary | ICD-10-CM | POA: Diagnosis not present

## 2019-11-12 DIAGNOSIS — E782 Mixed hyperlipidemia: Secondary | ICD-10-CM | POA: Diagnosis not present

## 2019-11-12 DIAGNOSIS — M1990 Unspecified osteoarthritis, unspecified site: Secondary | ICD-10-CM | POA: Diagnosis not present

## 2019-11-12 DIAGNOSIS — F988 Other specified behavioral and emotional disorders with onset usually occurring in childhood and adolescence: Secondary | ICD-10-CM | POA: Diagnosis not present

## 2019-11-12 DIAGNOSIS — K589 Irritable bowel syndrome without diarrhea: Secondary | ICD-10-CM | POA: Diagnosis not present

## 2019-11-12 DIAGNOSIS — I10 Essential (primary) hypertension: Secondary | ICD-10-CM | POA: Diagnosis not present

## 2019-11-13 DIAGNOSIS — M545 Low back pain: Secondary | ICD-10-CM | POA: Diagnosis not present

## 2019-11-13 DIAGNOSIS — G8929 Other chronic pain: Secondary | ICD-10-CM | POA: Diagnosis not present

## 2019-11-13 DIAGNOSIS — M47817 Spondylosis without myelopathy or radiculopathy, lumbosacral region: Secondary | ICD-10-CM | POA: Diagnosis not present

## 2019-12-05 DIAGNOSIS — M47816 Spondylosis without myelopathy or radiculopathy, lumbar region: Secondary | ICD-10-CM | POA: Diagnosis not present

## 2019-12-05 DIAGNOSIS — M5416 Radiculopathy, lumbar region: Secondary | ICD-10-CM | POA: Diagnosis not present

## 2019-12-05 DIAGNOSIS — M7062 Trochanteric bursitis, left hip: Secondary | ICD-10-CM | POA: Diagnosis not present

## 2019-12-05 DIAGNOSIS — M62830 Muscle spasm of back: Secondary | ICD-10-CM | POA: Diagnosis not present

## 2019-12-05 DIAGNOSIS — M5136 Other intervertebral disc degeneration, lumbar region: Secondary | ICD-10-CM | POA: Diagnosis not present

## 2019-12-05 DIAGNOSIS — I10 Essential (primary) hypertension: Secondary | ICD-10-CM | POA: Diagnosis not present

## 2019-12-10 DIAGNOSIS — Z1231 Encounter for screening mammogram for malignant neoplasm of breast: Secondary | ICD-10-CM | POA: Diagnosis not present

## 2020-01-16 DIAGNOSIS — M461 Sacroiliitis, not elsewhere classified: Secondary | ICD-10-CM | POA: Diagnosis not present

## 2020-01-16 DIAGNOSIS — M62830 Muscle spasm of back: Secondary | ICD-10-CM | POA: Diagnosis not present

## 2020-01-16 DIAGNOSIS — M7062 Trochanteric bursitis, left hip: Secondary | ICD-10-CM | POA: Diagnosis not present

## 2020-01-16 DIAGNOSIS — M1612 Unilateral primary osteoarthritis, left hip: Secondary | ICD-10-CM | POA: Diagnosis not present

## 2020-01-16 DIAGNOSIS — M47816 Spondylosis without myelopathy or radiculopathy, lumbar region: Secondary | ICD-10-CM | POA: Diagnosis not present

## 2020-01-16 DIAGNOSIS — M5416 Radiculopathy, lumbar region: Secondary | ICD-10-CM | POA: Diagnosis not present

## 2020-01-16 DIAGNOSIS — M2550 Pain in unspecified joint: Secondary | ICD-10-CM | POA: Diagnosis not present

## 2020-01-16 DIAGNOSIS — M5136 Other intervertebral disc degeneration, lumbar region: Secondary | ICD-10-CM | POA: Diagnosis not present

## 2020-01-16 DIAGNOSIS — I10 Essential (primary) hypertension: Secondary | ICD-10-CM | POA: Diagnosis not present

## 2020-02-13 DIAGNOSIS — E782 Mixed hyperlipidemia: Secondary | ICD-10-CM | POA: Diagnosis not present

## 2020-02-13 DIAGNOSIS — I10 Essential (primary) hypertension: Secondary | ICD-10-CM | POA: Diagnosis not present

## 2020-02-13 DIAGNOSIS — F988 Other specified behavioral and emotional disorders with onset usually occurring in childhood and adolescence: Secondary | ICD-10-CM | POA: Diagnosis not present

## 2020-04-08 DIAGNOSIS — M7072 Other bursitis of hip, left hip: Secondary | ICD-10-CM | POA: Diagnosis not present

## 2020-04-08 DIAGNOSIS — M2550 Pain in unspecified joint: Secondary | ICD-10-CM | POA: Diagnosis not present

## 2020-04-08 DIAGNOSIS — M150 Primary generalized (osteo)arthritis: Secondary | ICD-10-CM | POA: Diagnosis not present

## 2020-04-08 DIAGNOSIS — E669 Obesity, unspecified: Secondary | ICD-10-CM | POA: Diagnosis not present

## 2020-04-08 DIAGNOSIS — Z6835 Body mass index (BMI) 35.0-35.9, adult: Secondary | ICD-10-CM | POA: Diagnosis not present

## 2020-04-08 DIAGNOSIS — M5136 Other intervertebral disc degeneration, lumbar region: Secondary | ICD-10-CM | POA: Diagnosis not present

## 2020-04-08 DIAGNOSIS — R5383 Other fatigue: Secondary | ICD-10-CM | POA: Diagnosis not present

## 2020-04-08 DIAGNOSIS — R768 Other specified abnormal immunological findings in serum: Secondary | ICD-10-CM | POA: Diagnosis not present

## 2020-04-29 DIAGNOSIS — E038 Other specified hypothyroidism: Secondary | ICD-10-CM | POA: Diagnosis not present

## 2020-04-30 DIAGNOSIS — E669 Obesity, unspecified: Secondary | ICD-10-CM | POA: Diagnosis not present

## 2020-04-30 DIAGNOSIS — M150 Primary generalized (osteo)arthritis: Secondary | ICD-10-CM | POA: Diagnosis not present

## 2020-04-30 DIAGNOSIS — M5136 Other intervertebral disc degeneration, lumbar region: Secondary | ICD-10-CM | POA: Diagnosis not present

## 2020-04-30 DIAGNOSIS — Z6835 Body mass index (BMI) 35.0-35.9, adult: Secondary | ICD-10-CM | POA: Diagnosis not present

## 2020-04-30 DIAGNOSIS — M2550 Pain in unspecified joint: Secondary | ICD-10-CM | POA: Diagnosis not present

## 2020-05-15 DIAGNOSIS — G8929 Other chronic pain: Secondary | ICD-10-CM | POA: Diagnosis not present

## 2020-05-15 DIAGNOSIS — E782 Mixed hyperlipidemia: Secondary | ICD-10-CM | POA: Diagnosis not present

## 2020-05-15 DIAGNOSIS — F988 Other specified behavioral and emotional disorders with onset usually occurring in childhood and adolescence: Secondary | ICD-10-CM | POA: Diagnosis not present

## 2020-05-15 DIAGNOSIS — M2550 Pain in unspecified joint: Secondary | ICD-10-CM | POA: Diagnosis not present

## 2020-06-10 DIAGNOSIS — E038 Other specified hypothyroidism: Secondary | ICD-10-CM | POA: Diagnosis not present

## 2020-08-05 DIAGNOSIS — E782 Mixed hyperlipidemia: Secondary | ICD-10-CM | POA: Diagnosis not present

## 2020-08-18 DIAGNOSIS — R2681 Unsteadiness on feet: Secondary | ICD-10-CM | POA: Diagnosis not present

## 2020-08-18 DIAGNOSIS — Z0000 Encounter for general adult medical examination without abnormal findings: Secondary | ICD-10-CM | POA: Diagnosis not present

## 2020-08-18 DIAGNOSIS — I10 Essential (primary) hypertension: Secondary | ICD-10-CM | POA: Diagnosis not present

## 2020-08-18 DIAGNOSIS — M19042 Primary osteoarthritis, left hand: Secondary | ICD-10-CM | POA: Diagnosis not present

## 2020-08-18 DIAGNOSIS — E782 Mixed hyperlipidemia: Secondary | ICD-10-CM | POA: Diagnosis not present

## 2020-08-18 DIAGNOSIS — M79672 Pain in left foot: Secondary | ICD-10-CM | POA: Diagnosis not present

## 2020-08-18 DIAGNOSIS — M79671 Pain in right foot: Secondary | ICD-10-CM | POA: Diagnosis not present

## 2020-08-18 DIAGNOSIS — Z1159 Encounter for screening for other viral diseases: Secondary | ICD-10-CM | POA: Diagnosis not present

## 2020-08-18 DIAGNOSIS — M19041 Primary osteoarthritis, right hand: Secondary | ICD-10-CM | POA: Diagnosis not present

## 2020-08-18 DIAGNOSIS — H6123 Impacted cerumen, bilateral: Secondary | ICD-10-CM | POA: Diagnosis not present

## 2020-08-18 DIAGNOSIS — F988 Other specified behavioral and emotional disorders with onset usually occurring in childhood and adolescence: Secondary | ICD-10-CM | POA: Diagnosis not present

## 2020-09-01 DIAGNOSIS — M19072 Primary osteoarthritis, left ankle and foot: Secondary | ICD-10-CM | POA: Diagnosis not present

## 2020-09-01 DIAGNOSIS — M19071 Primary osteoarthritis, right ankle and foot: Secondary | ICD-10-CM | POA: Diagnosis not present

## 2020-09-01 DIAGNOSIS — M79671 Pain in right foot: Secondary | ICD-10-CM | POA: Diagnosis not present

## 2020-09-01 DIAGNOSIS — M79672 Pain in left foot: Secondary | ICD-10-CM | POA: Diagnosis not present

## 2020-09-02 DIAGNOSIS — M19071 Primary osteoarthritis, right ankle and foot: Secondary | ICD-10-CM | POA: Diagnosis not present

## 2020-10-03 DIAGNOSIS — M7062 Trochanteric bursitis, left hip: Secondary | ICD-10-CM | POA: Diagnosis not present

## 2020-12-17 DIAGNOSIS — F988 Other specified behavioral and emotional disorders with onset usually occurring in childhood and adolescence: Secondary | ICD-10-CM | POA: Diagnosis not present

## 2020-12-17 DIAGNOSIS — K580 Irritable bowel syndrome with diarrhea: Secondary | ICD-10-CM | POA: Diagnosis not present

## 2020-12-17 DIAGNOSIS — S60219A Contusion of unspecified wrist, initial encounter: Secondary | ICD-10-CM | POA: Diagnosis not present

## 2020-12-17 DIAGNOSIS — Z7982 Long term (current) use of aspirin: Secondary | ICD-10-CM | POA: Diagnosis not present

## 2020-12-17 DIAGNOSIS — Z1231 Encounter for screening mammogram for malignant neoplasm of breast: Secondary | ICD-10-CM | POA: Diagnosis not present

## 2020-12-17 DIAGNOSIS — Z79899 Other long term (current) drug therapy: Secondary | ICD-10-CM | POA: Diagnosis not present

## 2020-12-17 DIAGNOSIS — S93402A Sprain of unspecified ligament of left ankle, initial encounter: Secondary | ICD-10-CM | POA: Diagnosis not present

## 2021-03-04 DIAGNOSIS — N6321 Unspecified lump in the left breast, upper outer quadrant: Secondary | ICD-10-CM | POA: Diagnosis not present

## 2021-03-05 DIAGNOSIS — N641 Fat necrosis of breast: Secondary | ICD-10-CM | POA: Diagnosis not present

## 2021-03-05 DIAGNOSIS — R928 Other abnormal and inconclusive findings on diagnostic imaging of breast: Secondary | ICD-10-CM | POA: Diagnosis not present

## 2021-03-05 DIAGNOSIS — N6321 Unspecified lump in the left breast, upper outer quadrant: Secondary | ICD-10-CM | POA: Diagnosis not present

## 2021-03-30 DIAGNOSIS — R0989 Other specified symptoms and signs involving the circulatory and respiratory systems: Secondary | ICD-10-CM | POA: Diagnosis not present

## 2021-03-30 DIAGNOSIS — F4321 Adjustment disorder with depressed mood: Secondary | ICD-10-CM | POA: Diagnosis not present

## 2021-03-30 DIAGNOSIS — U099 Post covid-19 condition, unspecified: Secondary | ICD-10-CM | POA: Diagnosis not present

## 2021-04-16 DIAGNOSIS — E038 Other specified hypothyroidism: Secondary | ICD-10-CM | POA: Diagnosis not present

## 2021-04-20 DIAGNOSIS — E782 Mixed hyperlipidemia: Secondary | ICD-10-CM | POA: Diagnosis not present

## 2021-04-20 DIAGNOSIS — I10 Essential (primary) hypertension: Secondary | ICD-10-CM | POA: Diagnosis not present

## 2021-04-20 DIAGNOSIS — F988 Other specified behavioral and emotional disorders with onset usually occurring in childhood and adolescence: Secondary | ICD-10-CM | POA: Diagnosis not present

## 2021-04-20 DIAGNOSIS — E538 Deficiency of other specified B group vitamins: Secondary | ICD-10-CM | POA: Diagnosis not present

## 2021-04-23 DIAGNOSIS — E538 Deficiency of other specified B group vitamins: Secondary | ICD-10-CM | POA: Diagnosis not present

## 2021-04-23 DIAGNOSIS — I10 Essential (primary) hypertension: Secondary | ICD-10-CM | POA: Diagnosis not present

## 2021-04-23 DIAGNOSIS — E782 Mixed hyperlipidemia: Secondary | ICD-10-CM | POA: Diagnosis not present

## 2021-04-23 DIAGNOSIS — M7062 Trochanteric bursitis, left hip: Secondary | ICD-10-CM | POA: Diagnosis not present

## 2021-05-13 DIAGNOSIS — E038 Other specified hypothyroidism: Secondary | ICD-10-CM | POA: Diagnosis not present

## 2021-08-11 DIAGNOSIS — M7062 Trochanteric bursitis, left hip: Secondary | ICD-10-CM | POA: Diagnosis not present

## 2021-08-18 DIAGNOSIS — E782 Mixed hyperlipidemia: Secondary | ICD-10-CM | POA: Diagnosis not present

## 2021-08-18 DIAGNOSIS — E538 Deficiency of other specified B group vitamins: Secondary | ICD-10-CM | POA: Diagnosis not present
